# Patient Record
Sex: Female | Born: 1935 | Race: Black or African American | Hispanic: No | Smoking: Never smoker
Health system: Southern US, Community
[De-identification: ages and names within clinical notes are randomized; demographics above are authoritative.]

## PROBLEM LIST (undated history)

## (undated) DIAGNOSIS — D649 Anemia, unspecified: Secondary | ICD-10-CM

## (undated) DIAGNOSIS — J449 Chronic obstructive pulmonary disease, unspecified: Secondary | ICD-10-CM

## (undated) DIAGNOSIS — Z992 Dependence on renal dialysis: Secondary | ICD-10-CM

## (undated) DIAGNOSIS — I10 Essential (primary) hypertension: Secondary | ICD-10-CM

## (undated) DIAGNOSIS — N289 Disorder of kidney and ureter, unspecified: Secondary | ICD-10-CM

## (undated) DIAGNOSIS — H35359 Cystoid macular degeneration, unspecified eye: Secondary | ICD-10-CM

## (undated) HISTORY — DX: Chronic obstructive pulmonary disease, unspecified: J44.9

## (undated) HISTORY — PX: APPENDECTOMY: SHX54

## (undated) HISTORY — PX: CATARACT EXTRACTION: SUR2

## (undated) HISTORY — DX: Dependence on renal dialysis: Z99.2

## (undated) HISTORY — DX: Anemia, unspecified: D64.9

## (undated) HISTORY — PX: ABDOMINAL HYSTERECTOMY: SHX81

## (undated) HISTORY — PX: ARTERIOVENOUS GRAFT PLACEMENT: SUR1029

## (undated) HISTORY — PX: KYPHOSIS SURGERY: SHX114

## (undated) HISTORY — DX: Cystoid macular degeneration, unspecified eye: H35.359

---

## 1997-08-14 ENCOUNTER — Other Ambulatory Visit: Admission: RE | Admit: 1997-08-14 | Discharge: 1997-08-14 | Payer: Self-pay | Admitting: Family Medicine

## 1997-08-20 ENCOUNTER — Other Ambulatory Visit: Admission: RE | Admit: 1997-08-20 | Discharge: 1997-08-20 | Payer: Self-pay | Admitting: *Deleted

## 1999-09-23 ENCOUNTER — Encounter: Admission: RE | Admit: 1999-09-23 | Discharge: 1999-09-23 | Payer: Self-pay | Admitting: Family Medicine

## 1999-09-23 ENCOUNTER — Encounter: Payer: Self-pay | Admitting: Family Medicine

## 2000-02-03 ENCOUNTER — Encounter: Payer: Self-pay | Admitting: Family Medicine

## 2000-02-03 ENCOUNTER — Encounter: Admission: RE | Admit: 2000-02-03 | Discharge: 2000-02-03 | Payer: Self-pay | Admitting: Family Medicine

## 2000-10-13 ENCOUNTER — Encounter: Payer: Self-pay | Admitting: Family Medicine

## 2000-10-13 ENCOUNTER — Encounter: Admission: RE | Admit: 2000-10-13 | Discharge: 2000-10-13 | Payer: Self-pay | Admitting: Family Medicine

## 2002-09-27 ENCOUNTER — Ambulatory Visit (HOSPITAL_COMMUNITY): Admission: RE | Admit: 2002-09-27 | Discharge: 2002-09-27 | Payer: Self-pay | Admitting: Family Medicine

## 2002-09-27 ENCOUNTER — Encounter: Payer: Self-pay | Admitting: Family Medicine

## 2004-06-08 ENCOUNTER — Encounter: Admission: RE | Admit: 2004-06-08 | Discharge: 2004-06-08 | Payer: Self-pay | Admitting: Family Medicine

## 2004-07-16 ENCOUNTER — Ambulatory Visit (HOSPITAL_COMMUNITY): Admission: RE | Admit: 2004-07-16 | Discharge: 2004-07-16 | Payer: Self-pay | Admitting: Nephrology

## 2004-08-13 ENCOUNTER — Ambulatory Visit (HOSPITAL_COMMUNITY): Admission: RE | Admit: 2004-08-13 | Discharge: 2004-08-13 | Payer: Self-pay | Admitting: Vascular Surgery

## 2004-10-26 ENCOUNTER — Emergency Department (HOSPITAL_COMMUNITY): Admission: EM | Admit: 2004-10-26 | Discharge: 2004-10-26 | Payer: Self-pay | Admitting: Emergency Medicine

## 2004-11-17 ENCOUNTER — Ambulatory Visit (HOSPITAL_COMMUNITY): Admission: RE | Admit: 2004-11-17 | Discharge: 2004-11-17 | Payer: Self-pay | Admitting: Nephrology

## 2005-01-07 ENCOUNTER — Ambulatory Visit (HOSPITAL_COMMUNITY): Admission: RE | Admit: 2005-01-07 | Discharge: 2005-01-07 | Payer: Self-pay | Admitting: Nephrology

## 2005-01-13 ENCOUNTER — Inpatient Hospital Stay (HOSPITAL_COMMUNITY): Admission: EM | Admit: 2005-01-13 | Discharge: 2005-01-19 | Payer: Self-pay | Admitting: Emergency Medicine

## 2005-01-19 ENCOUNTER — Ambulatory Visit: Payer: Self-pay | Admitting: Cardiology

## 2005-02-23 ENCOUNTER — Encounter (HOSPITAL_COMMUNITY): Admission: RE | Admit: 2005-02-23 | Discharge: 2005-05-24 | Payer: Self-pay | Admitting: Nephrology

## 2005-04-29 ENCOUNTER — Ambulatory Visit (HOSPITAL_COMMUNITY): Admission: RE | Admit: 2005-04-29 | Discharge: 2005-04-29 | Payer: Self-pay | Admitting: Nephrology

## 2005-07-15 ENCOUNTER — Encounter: Admission: RE | Admit: 2005-07-15 | Discharge: 2005-07-15 | Payer: Self-pay | Admitting: Family Medicine

## 2006-01-16 ENCOUNTER — Emergency Department (HOSPITAL_COMMUNITY): Admission: EM | Admit: 2006-01-16 | Discharge: 2006-01-16 | Payer: Self-pay | Admitting: Emergency Medicine

## 2006-07-28 ENCOUNTER — Ambulatory Visit (HOSPITAL_COMMUNITY): Admission: RE | Admit: 2006-07-28 | Discharge: 2006-07-28 | Payer: Self-pay | Admitting: Nephrology

## 2007-08-16 ENCOUNTER — Ambulatory Visit (HOSPITAL_COMMUNITY): Admission: RE | Admit: 2007-08-16 | Discharge: 2007-08-16 | Payer: Self-pay | Admitting: Ophthalmology

## 2007-09-14 ENCOUNTER — Ambulatory Visit (HOSPITAL_COMMUNITY): Admission: RE | Admit: 2007-09-14 | Discharge: 2007-09-14 | Payer: Self-pay | Admitting: Nephrology

## 2007-10-21 ENCOUNTER — Emergency Department (HOSPITAL_COMMUNITY): Admission: EM | Admit: 2007-10-21 | Discharge: 2007-10-21 | Payer: Self-pay | Admitting: Emergency Medicine

## 2008-01-10 ENCOUNTER — Inpatient Hospital Stay (HOSPITAL_COMMUNITY): Admission: EM | Admit: 2008-01-10 | Discharge: 2008-02-05 | Payer: Self-pay | Admitting: Emergency Medicine

## 2008-01-19 ENCOUNTER — Encounter (INDEPENDENT_AMBULATORY_CARE_PROVIDER_SITE_OTHER): Payer: Self-pay | Admitting: Surgery

## 2008-03-21 ENCOUNTER — Ambulatory Visit (HOSPITAL_COMMUNITY): Admission: RE | Admit: 2008-03-21 | Discharge: 2008-03-21 | Payer: Self-pay | Admitting: Nephrology

## 2008-04-15 ENCOUNTER — Inpatient Hospital Stay (HOSPITAL_COMMUNITY): Admission: EM | Admit: 2008-04-15 | Discharge: 2008-04-17 | Payer: Self-pay | Admitting: Emergency Medicine

## 2008-04-19 ENCOUNTER — Inpatient Hospital Stay (HOSPITAL_COMMUNITY): Admission: EM | Admit: 2008-04-19 | Discharge: 2008-04-25 | Payer: Self-pay | Admitting: Emergency Medicine

## 2008-06-04 ENCOUNTER — Inpatient Hospital Stay (HOSPITAL_COMMUNITY): Admission: EM | Admit: 2008-06-04 | Discharge: 2008-06-10 | Payer: Self-pay | Admitting: Emergency Medicine

## 2008-06-05 ENCOUNTER — Encounter (INDEPENDENT_AMBULATORY_CARE_PROVIDER_SITE_OTHER): Payer: Self-pay | Admitting: Interventional Radiology

## 2008-06-06 ENCOUNTER — Encounter (INDEPENDENT_AMBULATORY_CARE_PROVIDER_SITE_OTHER): Payer: Self-pay | Admitting: Interventional Radiology

## 2008-06-18 ENCOUNTER — Encounter: Payer: Self-pay | Admitting: Interventional Radiology

## 2008-07-18 ENCOUNTER — Ambulatory Visit (HOSPITAL_COMMUNITY): Admission: RE | Admit: 2008-07-18 | Discharge: 2008-07-18 | Payer: Self-pay | Admitting: Nephrology

## 2008-08-23 ENCOUNTER — Ambulatory Visit: Payer: Self-pay | Admitting: Internal Medicine

## 2008-08-23 ENCOUNTER — Inpatient Hospital Stay (HOSPITAL_COMMUNITY): Admission: EM | Admit: 2008-08-23 | Discharge: 2008-08-29 | Payer: Self-pay | Admitting: Emergency Medicine

## 2008-08-23 ENCOUNTER — Encounter (INDEPENDENT_AMBULATORY_CARE_PROVIDER_SITE_OTHER): Payer: Self-pay | Admitting: Internal Medicine

## 2008-08-28 ENCOUNTER — Encounter (INDEPENDENT_AMBULATORY_CARE_PROVIDER_SITE_OTHER): Payer: Self-pay | Admitting: Interventional Radiology

## 2008-09-19 ENCOUNTER — Encounter: Payer: Self-pay | Admitting: Interventional Radiology

## 2008-10-01 ENCOUNTER — Ambulatory Visit (HOSPITAL_COMMUNITY): Admission: RE | Admit: 2008-10-01 | Discharge: 2008-10-01 | Payer: Self-pay | Admitting: Interventional Radiology

## 2008-10-03 ENCOUNTER — Encounter (INDEPENDENT_AMBULATORY_CARE_PROVIDER_SITE_OTHER): Payer: Self-pay | Admitting: Interventional Radiology

## 2008-10-03 ENCOUNTER — Ambulatory Visit (HOSPITAL_COMMUNITY): Admission: RE | Admit: 2008-10-03 | Discharge: 2008-10-03 | Payer: Self-pay | Admitting: Interventional Radiology

## 2008-11-13 ENCOUNTER — Ambulatory Visit (HOSPITAL_COMMUNITY): Admission: RE | Admit: 2008-11-13 | Discharge: 2008-11-13 | Payer: Self-pay | Admitting: Nephrology

## 2008-12-12 ENCOUNTER — Ambulatory Visit (HOSPITAL_COMMUNITY): Admission: RE | Admit: 2008-12-12 | Discharge: 2008-12-12 | Payer: Self-pay | Admitting: Family Medicine

## 2008-12-17 ENCOUNTER — Encounter: Payer: Self-pay | Admitting: Interventional Radiology

## 2009-02-18 ENCOUNTER — Ambulatory Visit (HOSPITAL_COMMUNITY): Admission: RE | Admit: 2009-02-18 | Discharge: 2009-02-18 | Payer: Self-pay | Admitting: Nephrology

## 2009-07-22 ENCOUNTER — Ambulatory Visit (HOSPITAL_COMMUNITY): Admission: RE | Admit: 2009-07-22 | Discharge: 2009-07-22 | Payer: Self-pay | Admitting: Nephrology

## 2009-10-21 ENCOUNTER — Encounter: Admission: RE | Admit: 2009-10-21 | Discharge: 2009-10-21 | Payer: Self-pay | Admitting: Nephrology

## 2009-11-11 ENCOUNTER — Encounter: Admission: RE | Admit: 2009-11-11 | Discharge: 2009-11-11 | Payer: Self-pay | Admitting: Family Medicine

## 2009-11-13 ENCOUNTER — Ambulatory Visit (HOSPITAL_COMMUNITY)
Admission: RE | Admit: 2009-11-13 | Discharge: 2009-11-13 | Payer: Self-pay | Source: Home / Self Care | Admitting: Nephrology

## 2009-11-21 IMAGING — NM NM GI BLOOD LOSS
2 series · 12 of 12 positions shown · non-contrast
Comparison: None

CLINICAL DATA: Active lower GI bleeding.

NUCLEAR MEDICINE GASTROINTESTINAL BLEEDING SCAN
TECHNIQUE: Sequential abdominal images were obtained following
intravenous administration of 2c-IIm labeled red blood cells.
Radiopharmaceutical:  26 mCi 2c-IIm in-vitro labeled red cells.

[Series 1: gi gi bleed · 4.70mm/px · 6 of 60 frames shown (1 of 2)]
[frame 6/60]
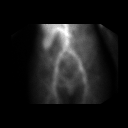
[frame 16/60]
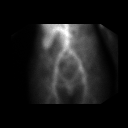
[frame 26/60]
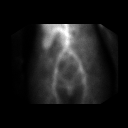
[frame 36/60]
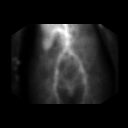
[frame 46/60]
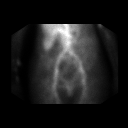
[frame 56/60]
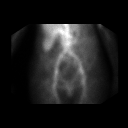

[Series 1: gi gi bleed · 4.70mm/px · 6 of 60 frames shown (2 of 2)]
[frame 6/60]
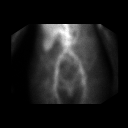
[frame 16/60]
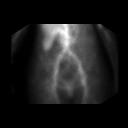
[frame 26/60]
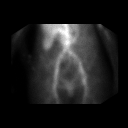
[frame 36/60]
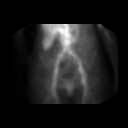
[frame 46/60]
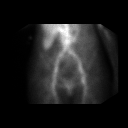
[frame 56/60]
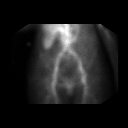

[12 of 12 positions shown; findings below may reference images not displayed]

FINDINGS: Normal radiopharmaceutical distribution of activity is
seen within the abdomen and pelvis.  No sites of active GI bleeding
are identified.  Imaging was carried [DATE] minutes.
IMPRESSION: Negative.  No evidence of active GI bleeding.

## 2009-11-27 IMAGING — CR DG CHEST 2V
1 series · 1 of 1 positions shown · non-contrast
Comparison: 08/16/2007

CLINICAL DATA: Lower GI bleed/elevated white blood cell count

CHEST - 2 VIEW

[w chest lat]
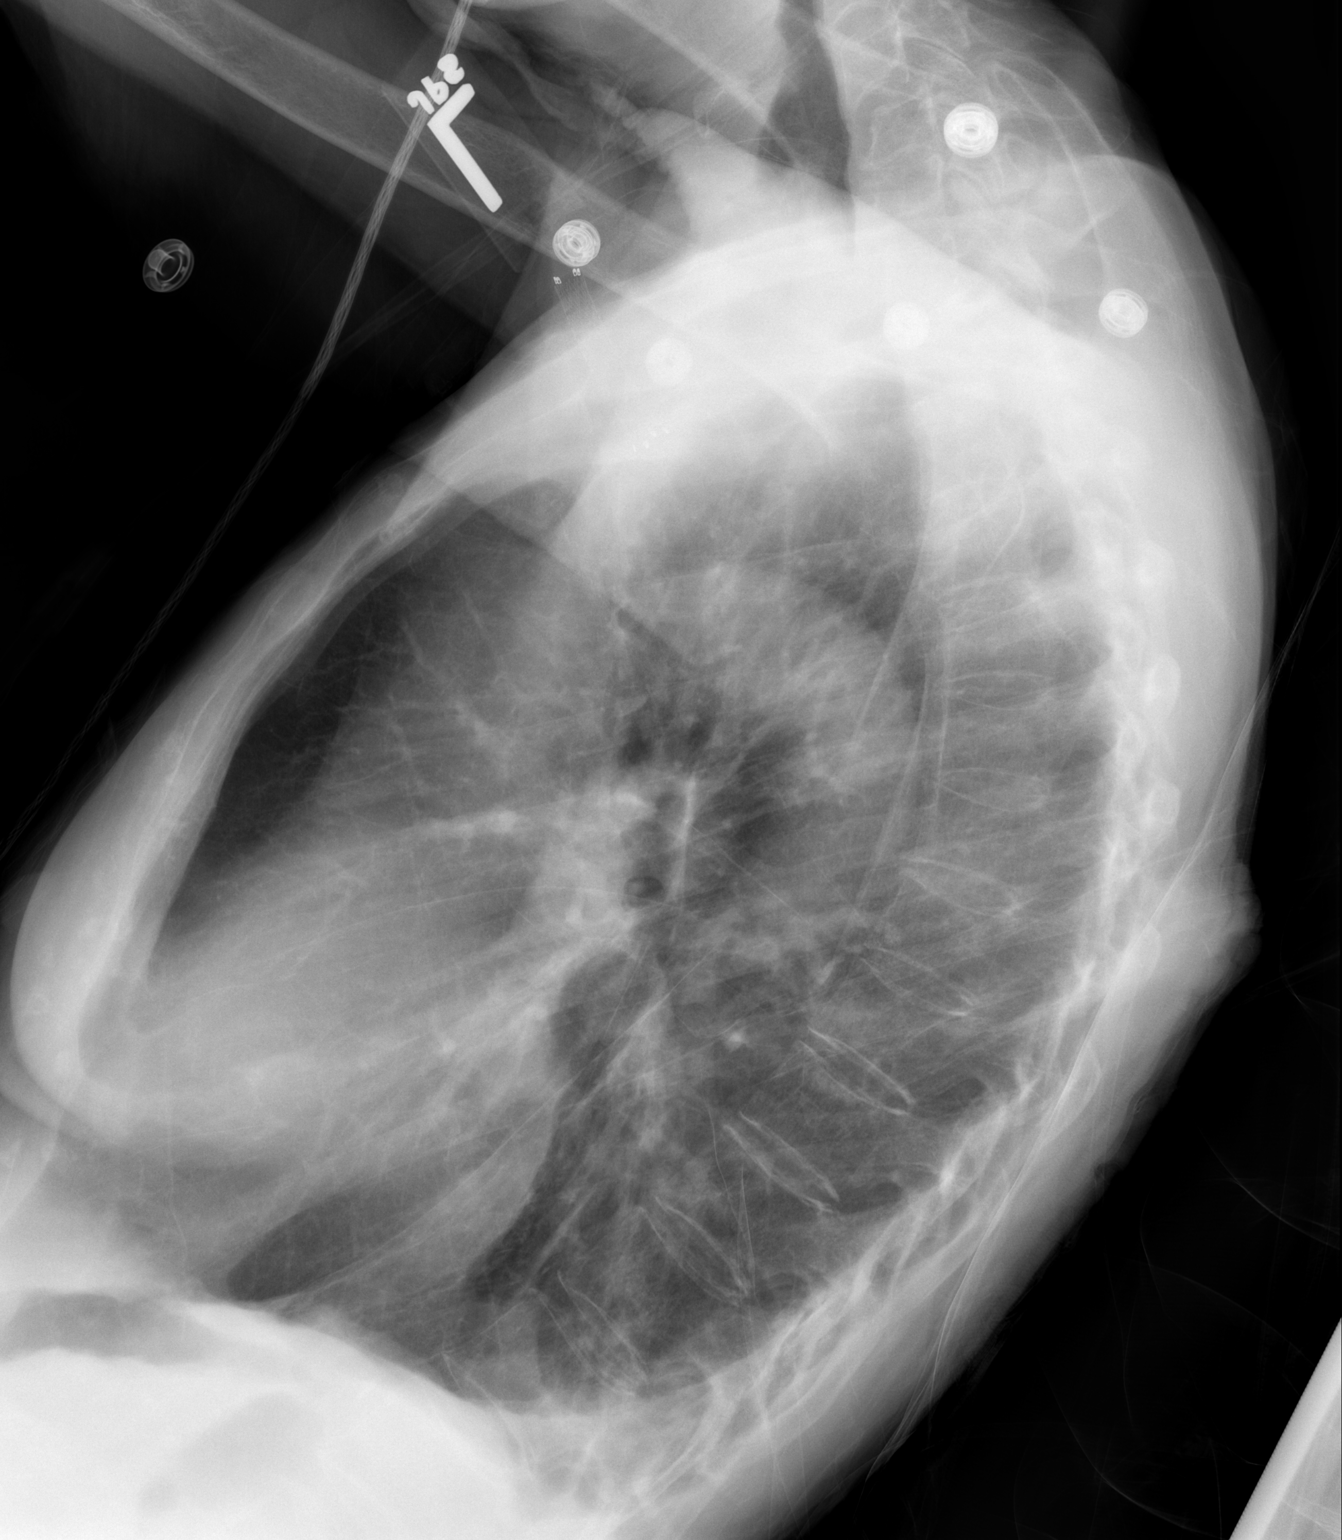

[1 of 1 positions shown; findings below may reference images not displayed]

FINDINGS: AP upright lateral views were obtained.  Heart size
within normal limits.  There is a subtle left lower lobe airspace
density seen best on the lateral view, abutting the major fissure.
There is also a small left pleural effusion.  The findings suggest
acute pneumonia.  This needs careful clinical correlation.

Stable underlying changes of COPD.
IMPRESSION: 1.  COPD.
2.  Findings suspicious for acute left lower lobe pneumonia with
small pleural effusion.

## 2010-02-17 ENCOUNTER — Ambulatory Visit (HOSPITAL_COMMUNITY)
Admission: RE | Admit: 2010-02-17 | Discharge: 2010-02-17 | Payer: Self-pay | Source: Home / Self Care | Attending: Nephrology | Admitting: Nephrology

## 2010-04-09 ENCOUNTER — Ambulatory Visit (INDEPENDENT_AMBULATORY_CARE_PROVIDER_SITE_OTHER): Payer: Medicare Other

## 2010-04-09 DIAGNOSIS — N186 End stage renal disease: Secondary | ICD-10-CM

## 2010-04-10 ENCOUNTER — Ambulatory Visit: Payer: Self-pay

## 2010-04-10 NOTE — Assessment & Plan Note (Signed)
OFFICE VISIT  [name], Shelley M DOB:  [birth year]                                       04/09/2010 [id number]#:[id number]  CHIEF COMPLAINT:  Open area scabbed-over on the arteriovenous fistula.  The patient is an 77 year old woman who had a left upper arm AV fistula placed in November 2006.  Over the last month, she has an area where they had been sticking the fistula with a scabbed area which has now gotten slightly larger.  There has been no bleeding, no purulent material.  She has had no fever or chills.  There has been no redness in the area.  She has multiple skin tears secondary to her thin skin.  She is not on any blood thinners of Plavix, aspirin, or Coumadin. Otherwise, the fistula is functioning well.  PHYSICAL EXAM:  The patient has a scab in the upper portion of her AV fistula which is intact.  There is no erythema, no drainage, and no bleeding from the area.  She has a positive bruit in the AV fistula and no signs of steal.  The area around the scab is not tense. Neurovascular exam is intact.  Vital signs:  Her heart rate is 88, her sats are 96%, and her respiratory rate is 12.  ASSESSMENT:  Open scabbed area in the upper portion of left upper arm arteriovenous fistula without drainage, erythema, or sign any signs of infection.  PLAN: 1. The plan is to not stick the fistula in this area, to stick it     below the scabbed area.  Her family will clean this area with     peroxide and apply Neosporin and a nonadhesive dressing. 2. We will see the patient back in 2 weeks.  We will advise the     grandson that if she should bleed, showed him where to occlude the     fistula, place a dressing, and bring the patient to the emergency     room.  Dialysis center was called regarding this plan so that they     would be aware of where not to stick the fistula at this time.  Della Goo, PA-C  Charles E. Fields, MD Electronically Signed  RR/MEDQ   D:  04/09/2010  T:  04/09/2010  Job:  [id number]

## 2010-04-18 ENCOUNTER — Emergency Department (HOSPITAL_COMMUNITY)
Admission: EM | Admit: 2010-04-18 | Discharge: 2010-04-18 | Disposition: A | Discharge status: Home / Self Care | Payer: Medicare Other | Attending: Emergency Medicine | Admitting: Emergency Medicine

## 2010-04-18 DIAGNOSIS — I120 Hypertensive chronic kidney disease with stage 5 chronic kidney disease or end stage renal disease: Secondary | ICD-10-CM

## 2010-04-18 DIAGNOSIS — T82898A Other specified complication of vascular prosthetic devices, implants and grafts, initial encounter: Secondary | ICD-10-CM

## 2010-04-18 DIAGNOSIS — N186 End stage renal disease: Secondary | ICD-10-CM | POA: Insufficient documentation

## 2010-04-18 DIAGNOSIS — S51809A Unspecified open wound of unspecified forearm, initial encounter: Secondary | ICD-10-CM | POA: Insufficient documentation

## 2010-04-18 DIAGNOSIS — H409 Unspecified glaucoma: Secondary | ICD-10-CM | POA: Insufficient documentation

## 2010-04-18 DIAGNOSIS — X58XXXA Exposure to other specified factors, initial encounter: Secondary | ICD-10-CM | POA: Insufficient documentation

## 2010-04-18 DIAGNOSIS — Y841 Kidney dialysis as the cause of abnormal reaction of the patient, or of later complication, without mention of misadventure at the time of the procedure: Secondary | ICD-10-CM | POA: Insufficient documentation

## 2010-04-18 DIAGNOSIS — J4489 Other specified chronic obstructive pulmonary disease: Secondary | ICD-10-CM | POA: Insufficient documentation

## 2010-04-18 DIAGNOSIS — J449 Chronic obstructive pulmonary disease, unspecified: Secondary | ICD-10-CM | POA: Insufficient documentation

## 2010-04-18 LAB — APTT: aPTT: 28 seconds (ref 24–37)

## 2010-04-18 LAB — DIFFERENTIAL
Basophils Absolute: 0 10*3/uL (ref 0.0–0.1)
Basophils Relative: 0 % (ref 0–1)
Monocytes Absolute: 1.4 10*3/uL — ABNORMAL HIGH (ref 0.1–1.0)
Neutro Abs: 10 10*3/uL — ABNORMAL HIGH (ref 1.7–7.7)
Neutrophils Relative %: 74 % (ref 43–77)

## 2010-04-18 LAB — PROTIME-INR
INR: 0.97 (ref 0.00–1.49)
Prothrombin Time: 13.1 seconds (ref 11.6–15.2)

## 2010-04-18 LAB — CBC
Hemoglobin: 9.7 g/dL — ABNORMAL LOW (ref 12.0–15.0)
MCHC: 31.8 g/dL (ref 30.0–36.0)
WBC: 13.4 10*3/uL — ABNORMAL HIGH (ref 4.0–10.5)

## 2010-04-18 LAB — SAMPLE TO BLOOD BANK

## 2010-04-20 NOTE — Consult Note (Signed)
NAMEJAKHIYA, BROWER            ACCOUNT NO.:  [id number]  MEDICAL RECORD NO.:  [id number]           PATIENT TYPE:  E  LOCATION:  [facility]                         FACILITY:  [facility]  PHYSICIAN:  Quita Skye. Hart Rochester, M.D.  DATE OF BIRTH:  05/19/1919  DATE OF CONSULTATION:  04/18/2010 DATE OF DISCHARGE:  04/18/2010                                CONSULTATION   REASON FOR CONSULTATION:  Bleeding from AV graft, left upper extremity.  HISTORY OF PRESENT ILLNESS:  This is an 77 year old female, on chronic hemodialysis, dialyzing Monday, Wednesday, and Friday at the [facility], has had 1 previous episode of bleeding from an AV graft in the left upper arm.  She was seen in our office a few weeks ago and was to have a follow-up appointment later this coming week.  She has had no further recurrent bleeding and had dialysis yesterday, but today began bleeding at home.  She was brought to the emergency department where initially her blood pressure was in the 70s, but rapidly improve with fluid infusion to systolic of 100, and heart rate of 90-100.  She was alert and oriented the entire time and was never in shot.  She has had no chills, fever, or evidence of systemic infection.  CHRONIC MEDICAL PROBLEMS: 1. COPD. 2. Anemia. 3. Diverticulosis. 4. End-stage renal disease. 5. Hypertension. 6. Glaucoma.  SOCIAL HISTORY:  Does not use alcohol or drugs and does not smoke.  FAMILY HISTORY:  Negative for coronary artery disease, diabetes, or stroke.  REVIEW OF SYSTEMS:  The patient denies any chest pain, dyspnea on exertion, chronic bronchitis, pneumonia.  Elderly lives with her family. All other systems are negative.  PHYSICAL EXAM:  VITAL SIGNS:  Blood pressure is 100/60, heart rate is 90- 100. GENERAL:  She is alert and oriented x3. HEENT:  Normal for age.  EOMs intact. CHEST:  Clear to auscultation. ABDOMEN:  Soft, nontender, with no masses. EXTREMITIES:  Upper extremity exam on  the left reveals AV graft to have a good pulse and palpable thrill.  This dressing was intact without active bleeding.  I removed the dressing and there was a linear opening over the graft midway between the antecubital area and the axilla, which was about 4 mm long.  I removed the thrombosed plug which was in this opening.  There was no evidence of infection, purulence, or gangrenous tissue, and the opening was actually quite clean with good tissue.  While compressing the graft proximally and distally, I placed four 5-0 Prolene sutures, which easily approximated the skin and control the bleeding.  We then applied a pressure dressing and observe this for an hour and half in the emergency department with no evidence of recurrent bleeding.  The patient will be discharged with a compression dressing to remain in place until dialysis on Monday February 20 and will return to our office on Tuesday February 21 for further examination.  If she should have any evidence of recurrent bleeding, she should be return to the emergency department and she will need a new left upper arm graft and a Diatek catheter placed. Family is comfortable with this  plan and the patient will be accompanied by her family members and keep a close eye on her.     Quita Skye Hart Rochester, M.D.     JDL/MEDQ  D:  04/18/2010  T:  04/19/2010  Job:  [id number]  Electronically Signed by Josephina Gip M.D. on 04/20/2010 12:26:49 PM

## 2010-04-21 ENCOUNTER — Ambulatory Visit (INDEPENDENT_AMBULATORY_CARE_PROVIDER_SITE_OTHER): Payer: Medicare Other | Admitting: Vascular Surgery

## 2010-04-21 DIAGNOSIS — N186 End stage renal disease: Secondary | ICD-10-CM

## 2010-04-22 ENCOUNTER — Inpatient Hospital Stay (HOSPITAL_COMMUNITY)
Admission: RE | Admit: 2010-04-22 | Discharge: 2010-04-30 | DRG: 264 | Disposition: A | Discharge status: Home Health | Payer: Medicare Other | Source: Ambulatory Visit | Attending: Vascular Surgery | Admitting: Vascular Surgery

## 2010-04-22 ENCOUNTER — Inpatient Hospital Stay (HOSPITAL_COMMUNITY): Payer: Medicare Other

## 2010-04-22 ENCOUNTER — Ambulatory Visit (HOSPITAL_COMMUNITY): Payer: Medicare Other

## 2010-04-22 DIAGNOSIS — N186 End stage renal disease: Secondary | ICD-10-CM

## 2010-04-22 DIAGNOSIS — T82898A Other specified complication of vascular prosthetic devices, implants and grafts, initial encounter: Secondary | ICD-10-CM

## 2010-04-22 DIAGNOSIS — Y92009 Unspecified place in unspecified non-institutional (private) residence as the place of occurrence of the external cause: Secondary | ICD-10-CM

## 2010-04-22 DIAGNOSIS — N2581 Secondary hyperparathyroidism of renal origin: Secondary | ICD-10-CM | POA: Diagnosis present

## 2010-04-22 DIAGNOSIS — Y832 Surgical operation with anastomosis, bypass or graft as the cause of abnormal reaction of the patient, or of later complication, without mention of misadventure at the time of the procedure: Secondary | ICD-10-CM | POA: Diagnosis present

## 2010-04-22 DIAGNOSIS — J4489 Other specified chronic obstructive pulmonary disease: Secondary | ICD-10-CM | POA: Diagnosis present

## 2010-04-22 DIAGNOSIS — I120 Hypertensive chronic kidney disease with stage 5 chronic kidney disease or end stage renal disease: Secondary | ICD-10-CM

## 2010-04-22 DIAGNOSIS — I959 Hypotension, unspecified: Secondary | ICD-10-CM | POA: Diagnosis not present

## 2010-04-22 DIAGNOSIS — J95811 Postprocedural pneumothorax: Secondary | ICD-10-CM | POA: Diagnosis not present

## 2010-04-22 DIAGNOSIS — Z66 Do not resuscitate: Secondary | ICD-10-CM | POA: Diagnosis not present

## 2010-04-22 DIAGNOSIS — J189 Pneumonia, unspecified organism: Secondary | ICD-10-CM | POA: Diagnosis not present

## 2010-04-22 DIAGNOSIS — J449 Chronic obstructive pulmonary disease, unspecified: Secondary | ICD-10-CM | POA: Diagnosis present

## 2010-04-22 DIAGNOSIS — Z992 Dependence on renal dialysis: Secondary | ICD-10-CM

## 2010-04-22 DIAGNOSIS — D62 Acute posthemorrhagic anemia: Secondary | ICD-10-CM | POA: Diagnosis not present

## 2010-04-22 DIAGNOSIS — Z8673 Personal history of transient ischemic attack (TIA), and cerebral infarction without residual deficits: Secondary | ICD-10-CM

## 2010-04-22 DIAGNOSIS — D631 Anemia in chronic kidney disease: Secondary | ICD-10-CM | POA: Diagnosis present

## 2010-04-22 DIAGNOSIS — E8779 Other fluid overload: Secondary | ICD-10-CM | POA: Diagnosis not present

## 2010-04-22 DIAGNOSIS — Y9210 Unspecified residential institution as the place of occurrence of the external cause: Secondary | ICD-10-CM | POA: Diagnosis not present

## 2010-04-22 DIAGNOSIS — T827XXA Infection and inflammatory reaction due to other cardiac and vascular devices, implants and grafts, initial encounter: Principal | ICD-10-CM | POA: Diagnosis present

## 2010-04-22 DIAGNOSIS — K219 Gastro-esophageal reflux disease without esophagitis: Secondary | ICD-10-CM | POA: Diagnosis present

## 2010-04-22 DIAGNOSIS — Y841 Kidney dialysis as the cause of abnormal reaction of the patient, or of later complication, without mention of misadventure at the time of the procedure: Secondary | ICD-10-CM | POA: Diagnosis not present

## 2010-04-22 LAB — CBC
HCT: 21.4 % — ABNORMAL LOW (ref 36.0–46.0)
HCT: 20.3 % — ABNORMAL LOW (ref 36.0–46.0)
Hemoglobin: 6.6 g/dL — CL (ref 12.0–15.0)
Hemoglobin: 6.6 g/dL — CL (ref 12.0–15.0)
MCH: 34.7 pg — ABNORMAL HIGH (ref 26.0–34.0)
MCHC: 30.8 g/dL (ref 30.0–36.0)
MCHC: 32.5 g/dL (ref 30.0–36.0)
MCV: 106.8 fL — ABNORMAL HIGH (ref 78.0–100.0)

## 2010-04-22 LAB — POCT I-STAT 4, (NA,K, GLUC, HGB,HCT)
HCT: 28 % — ABNORMAL LOW (ref 36.0–46.0)
Hemoglobin: 9.5 g/dL — ABNORMAL LOW (ref 12.0–15.0)

## 2010-04-22 LAB — SURGICAL PCR SCREEN: MRSA, PCR: NEGATIVE

## 2010-04-22 LAB — BASIC METABOLIC PANEL
CO2: 24 mEq/L (ref 19–32)
Calcium: 8.5 mg/dL (ref 8.4–10.5)
Glucose, Bld: 108 mg/dL — ABNORMAL HIGH (ref 70–99)
Potassium: 4.1 mEq/L (ref 3.5–5.1)
Sodium: 140 mEq/L (ref 135–145)

## 2010-04-22 NOTE — Assessment & Plan Note (Signed)
OFFICE VISIT  Shelley, DOMKE [name] DOB:  1920/01/17                                       04/21/2010 [id number]#:[id number]  This patient returns today for further followup regarding her left upper arm AV graft.  She had a bleeding episode over the weekend and the area in the left upper arm graft where the bleeding occurred was sutured with 4 Prolene sutures and controlled effectively.  Today she returns for a followup visit to inspect the wound.  On exam, the left upper arm graft is patent with an excellent pulse and palpable thrill.  There is some erythema developing around the area of the sutures.  She has a well perfused left upper extremity.  Chest is clear to auscultation.  Blood pressure of 89/53, heart rate is 97, respiratory rate 18.  I feel that we do need to replace the graft since it does appear that this could be becoming infected.  I rewrapped the arm today with compression dressing and an Ace wrap.  Discussed the situation with the patient's son.  Tomorrow she will come to [facility] for replacement of the graft, insertion of Diatek catheter.  She will be accompanied by her family tonight and if any bleeding should occur, they will bring her to the Emergency Department.    [name] Rochester, [name].D. Electronically Signed  JDL/MEDQ  D:  04/21/2010  T:  04/22/2010  Job:  [id number]

## 2010-04-23 ENCOUNTER — Ambulatory Visit: Payer: Medicare Other

## 2010-04-23 ENCOUNTER — Observation Stay (HOSPITAL_COMMUNITY): Payer: Medicare Other

## 2010-04-23 LAB — CBC
MCH: 31.3 pg (ref 26.0–34.0)
MCHC: 33.6 g/dL (ref 30.0–36.0)
MCV: 93.1 fL (ref 78.0–100.0)
Platelets: 215 10*3/uL (ref 150–400)

## 2010-04-23 LAB — CARDIAC PANEL(CRET KIN+CKTOT+MB+TROPI): CK, MB: 1.5 ng/mL (ref 0.3–4.0)

## 2010-04-23 LAB — RENAL FUNCTION PANEL
BUN: 30 mg/dL — ABNORMAL HIGH (ref 6–23)
CO2: 23 mEq/L (ref 19–32)
Calcium: 8.5 mg/dL (ref 8.4–10.5)
Creatinine, Ser: 7.44 mg/dL — ABNORMAL HIGH (ref 0.4–1.2)
GFR calc Af Amer: 6 mL/min — ABNORMAL LOW (ref >60)
GFR calc non Af Amer: 5 mL/min — ABNORMAL LOW (ref >60)

## 2010-04-24 ENCOUNTER — Observation Stay (HOSPITAL_COMMUNITY): Payer: Medicare Other

## 2010-04-24 LAB — CROSSMATCH
ABO/RH(D): O NEG
Antibody Screen: NEGATIVE
Unit division: 0
Unit division: 0

## 2010-04-24 LAB — RENAL FUNCTION PANEL
CO2: 23 mEq/L (ref 19–32)
Calcium: 8.5 mg/dL (ref 8.4–10.5)
Chloride: 103 mEq/L (ref 96–112)
GFR calc Af Amer: 7 mL/min — ABNORMAL LOW (ref >60)
GFR calc non Af Amer: 6 mL/min — ABNORMAL LOW (ref >60)
Glucose, Bld: 170 mg/dL — ABNORMAL HIGH (ref 70–99)
Potassium: 4.1 mEq/L (ref 3.5–5.1)
Sodium: 139 mEq/L (ref 135–145)

## 2010-04-24 LAB — CBC
HCT: 30.7 % — ABNORMAL LOW (ref 36.0–46.0)
Hemoglobin: 10.2 g/dL — ABNORMAL LOW (ref 12.0–15.0)
MCH: 31.2 pg (ref 26.0–34.0)
MCHC: 33.2 g/dL (ref 30.0–36.0)
MCV: 93.9 fL (ref 78.0–100.0)
Platelets: 217 K/uL (ref 150–400)
RBC: 3.27 MIL/uL — ABNORMAL LOW (ref 3.87–5.11)
RDW: 19.7 % — ABNORMAL HIGH (ref 11.5–15.5)
WBC: 13.6 K/uL — ABNORMAL HIGH (ref 4.0–10.5)

## 2010-04-25 LAB — CBC
HCT: 26.9 % — ABNORMAL LOW (ref 36.0–46.0)
Hemoglobin: 8.8 g/dL — ABNORMAL LOW (ref 12.0–15.0)
MCV: 92.8 fL (ref 78.0–100.0)
RBC: 2.9 MIL/uL — ABNORMAL LOW (ref 3.87–5.11)
WBC: 11.4 10*3/uL — ABNORMAL HIGH (ref 4.0–10.5)

## 2010-04-25 LAB — RENAL FUNCTION PANEL
BUN: 6 mg/dL (ref 6–23)
CO2: 29 mEq/L (ref 19–32)
Chloride: 100 mEq/L (ref 96–112)
Glucose, Bld: 100 mg/dL — ABNORMAL HIGH (ref 70–99)
Phosphorus: 1.9 mg/dL — ABNORMAL LOW (ref 2.3–4.6)
Potassium: 3.7 mEq/L (ref 3.5–5.1)
Sodium: 138 mEq/L (ref 135–145)

## 2010-04-25 LAB — WOUND CULTURE

## 2010-04-26 ENCOUNTER — Observation Stay (HOSPITAL_COMMUNITY): Payer: Medicare Other

## 2010-04-26 LAB — CBC
Hemoglobin: 9.3 g/dL — ABNORMAL LOW (ref 12.0–15.0)
MCH: 30.4 pg (ref 26.0–34.0)
MCV: 93.1 fL (ref 78.0–100.0)
Platelets: 225 10*3/uL (ref 150–400)
RBC: 3.06 MIL/uL — ABNORMAL LOW (ref 3.87–5.11)
WBC: 12.9 10*3/uL — ABNORMAL HIGH (ref 4.0–10.5)

## 2010-04-26 LAB — RENAL FUNCTION PANEL
Albumin: 2.8 g/dL — ABNORMAL LOW (ref 3.5–5.2)
BUN: 21 mg/dL (ref 6–23)
CO2: 26 mEq/L (ref 19–32)
Chloride: 97 mEq/L (ref 96–112)
Creatinine, Ser: 5.99 mg/dL — ABNORMAL HIGH (ref 0.4–1.2)
GFR calc non Af Amer: 7 mL/min — ABNORMAL LOW (ref >60)
Potassium: 4.1 mEq/L (ref 3.5–5.1)

## 2010-04-27 ENCOUNTER — Inpatient Hospital Stay (HOSPITAL_COMMUNITY): Payer: Medicare Other

## 2010-04-27 ENCOUNTER — Observation Stay (HOSPITAL_COMMUNITY): Payer: Medicare Other

## 2010-04-27 LAB — RENAL FUNCTION PANEL
CO2: 19 mEq/L (ref 19–32)
Chloride: 98 mEq/L (ref 96–112)
GFR calc Af Amer: 5 mL/min — ABNORMAL LOW (ref >60)
GFR calc non Af Amer: 4 mL/min — ABNORMAL LOW (ref >60)
Glucose, Bld: 207 mg/dL — ABNORMAL HIGH (ref 70–99)
Sodium: 131 mEq/L — ABNORMAL LOW (ref 135–145)

## 2010-04-27 LAB — CBC
HCT: 28.3 % — ABNORMAL LOW (ref 36.0–46.0)
Hemoglobin: 9.4 g/dL — ABNORMAL LOW (ref 12.0–15.0)
MCH: 31 pg (ref 26.0–34.0)
MCHC: 33.2 g/dL (ref 30.0–36.0)
RBC: 3.03 MIL/uL — ABNORMAL LOW (ref 3.87–5.11)

## 2010-04-27 NOTE — Op Note (Signed)
NAMEKINDELL, STRADA            ACCOUNT NO.:  [id number]  MEDICAL RECORD NO.:  [id number]           PATIENT TYPE:  I  LOCATION:  2610                         FACILITY:  [facility]  PHYSICIAN:  Quita Skye. Hart Rochester, M.D.  DATE OF BIRTH:  [birth year]  DATE OF PROCEDURE:  04/22/2010 DATE OF DISCHARGE:                              OPERATIVE REPORT   PREOPERATIVE DIAGNOSIS:  End-stage renal disease with AV Gore-Tex graft left upper arm with exposed segment with history of bleeding.  POSTOPERATIVE DIAGNOSIS:  End-stage renal disease with AV Gore-Tex graft left upper arm with exposed segment with history of bleeding.  OPERATION: 1. Insertion of a new left upper arm AV Gore-Tex graft, brachial     artery to axillary vein was 6 mm Gore-Tex. 2. Removal of exposed segment of left upper arm AV graft with primary     closure. 3. Bilateral ultrasound localization internal jugular veins. 4. Insertion Diatek catheter via right internal jugular vein (19 cm).  SURGEON:  Quita Skye. Hart Rochester, MD  FIRST ASSISTANT:  Della Goo, PA-C.  ANESTHESIA:  MAC.  PROCEDURE:  The patient was taken to the operating room, placed in supine position at which time the left upper extremity was prepped with Betadine scrub and solution draped in routine sterile manner.  After infiltration of 1% Xylocaine with epinephrine, and isolation of the exposed graft area in the mid upper arm with an adhesive drape, a short oblique incision was made near the arterial anastomosis.  Gore-Tex dissected free with 15 blade, had an excellent pulse and thrill.  Second incision was made near the axilla where the previous Gore-Tex axillary vein anastomosis had been performed.  Both of these were dissected free, new tunnel was then created peripheral to the old graft and a 6-mm Gore- Tex graft delivered through the tunnel.  The graft was occluded proximally and distally with vascular clamps and transected about 2-3 cm from the previous  arterial anastomosis.  The new graft anastomosed end- to-end with 6-0 Prolene.  The graft was then transected.  A few centimeters from the venous anastomosis, which was easily explored with a 5 dilator was widely patent.  The new graft anastomosed in end-to-end to the old graft with 6-0 Prolene.  Clamps were released, there was excellent pulse and thrill in the graft.  Adequate hemostasis achieved. Wounds were closed in layers with Vicryl in subcuticular fashion with Dermabond.  Sterile dressing applied.  Attention then turned to the area where the graft had been previously exposed, which was now nonfunctional.  After infiltration of 1% Xylocaine short longitudinal incision was made through this circular area, which was about a 1.5 cm in diameter.  The graft was very degenerative beneath this was dissected free.  The 15 blade and excised in this area and sent for culture. Adequate hemostasis was achieved.  The edges were then excised with Metzenbaum scissors.  Tissue was very poor in quality, but the attempt was made to close this using three interrupted 3-0 Vicryl sutures for the subcu and some very loosely placed 3-0 nylon sutures for the skin. Sterile dressing was then applied.  Attention turned to the upper  chest and neck, which was exposed both internal jugular veins were imaged using B-mode ultrasound, but the right noted to be bigger than the left. Both after infiltration of 1% Xylocaine prepping and draping, right internal jugular vein was entered using a supraclavicular approach after a few attempts were unsuccessful.  I did get some air upon aspiration on a few attempts in the right supraclavicular area suspicious for entering the pleural space.  The patient's O2 sats never changed and nor did her blood pressure.  After a few attempts of this, I did obtain a chest x- ray in the recovery room and there was questionable 5% apical pneumothorax.  I was then able to enter the right  internal jugular vein and a guidewire passed into the rate of right atrium under fluoroscopic guidance.  After dilating the tract appropriately a 19-cm Diatek catheter was passed through peel-away sheath positioned in the right atrium tunneled peripherally and secured with nylon suture.  Wound closed with Vicryl and subcuticular fashion.  Sterile dressing applied. The patient taken to recovery in stable condition.     Quita Skye Hart Rochester, M.D.     JDL/MEDQ  D:  04/22/2010  T:  04/23/2010  Job:  [id number]  Electronically Signed by Josephina Gip M.D. on 04/27/2010 04:15:04 PM

## 2010-04-28 ENCOUNTER — Inpatient Hospital Stay (HOSPITAL_COMMUNITY): Payer: Medicare Other

## 2010-04-28 LAB — RENAL FUNCTION PANEL
CO2: 25 mEq/L (ref 19–32)
Glucose, Bld: 71 mg/dL (ref 70–99)
Phosphorus: 1.2 mg/dL — ABNORMAL LOW (ref 2.3–4.6)
Potassium: 4.1 mEq/L (ref 3.5–5.1)
Sodium: 138 mEq/L (ref 135–145)

## 2010-04-28 LAB — CBC
Hemoglobin: 10.1 g/dL — ABNORMAL LOW (ref 12.0–15.0)
Platelets: 361 10*3/uL (ref 150–400)
RBC: 3.32 MIL/uL — ABNORMAL LOW (ref 3.87–5.11)
WBC: 18.7 10*3/uL — ABNORMAL HIGH (ref 4.0–10.5)

## 2010-04-28 LAB — GLUCOSE, CAPILLARY
Glucose-Capillary: 104 mg/dL — ABNORMAL HIGH (ref 70–99)
Glucose-Capillary: 95 mg/dL (ref 70–99)
Glucose-Capillary: 115 mg/dL — ABNORMAL HIGH (ref 70–99)
Glucose-Capillary: 172 mg/dL — ABNORMAL HIGH (ref 70–99)

## 2010-04-29 ENCOUNTER — Inpatient Hospital Stay (HOSPITAL_COMMUNITY): Payer: Medicare Other

## 2010-04-29 LAB — GLUCOSE, CAPILLARY
Glucose-Capillary: 87 mg/dL (ref 70–99)
Glucose-Capillary: 111 mg/dL — ABNORMAL HIGH (ref 70–99)

## 2010-04-29 LAB — RENAL FUNCTION PANEL
Albumin: 3 g/dL — ABNORMAL LOW (ref 3.5–5.2)
CO2: 23 mEq/L (ref 19–32)
Calcium: 9.3 mg/dL (ref 8.4–10.5)
GFR calc Af Amer: 8 mL/min — ABNORMAL LOW (ref >60)
GFR calc non Af Amer: 7 mL/min — ABNORMAL LOW (ref >60)
Phosphorus: 4.1 mg/dL (ref 2.3–4.6)
Sodium: 138 mEq/L (ref 135–145)

## 2010-04-29 LAB — CBC
Hemoglobin: 9.7 g/dL — ABNORMAL LOW (ref 12.0–15.0)
MCHC: 33.2 g/dL (ref 30.0–36.0)
Platelets: 361 10*3/uL (ref 150–400)

## 2010-04-29 LAB — IRON AND TIBC: Saturation Ratios: 37 % (ref 20–55)

## 2010-04-29 LAB — IRON: Iron: 73 ug/dL (ref 42–135)

## 2010-04-29 NOTE — Discharge Summary (Addendum)
[name], [name]            ACCOUNT NO.:  [id number]  MEDICAL RECORD NO.:  [id number]           PATIENT TYPE:  I  LOCATION:  2610                         FACILITY:  [facility]  PHYSICIAN:  Shelley Skye. Hart [name], M.D.  DATE OF BIRTH:  02-10-1920  DATE OF ADMISSION:  04/22/2010 DATE OF DISCHARGE:  04/28/2010                              DISCHARGE SUMMARY   CHIEF COMPLAINT:  Open draining wound over left upper arm AV Gore-Tex graft.  HISTORY OF PRESENT ILLNESS:  [name] is an 77 year old woman, who is on dialysis Monday, Wednesday, and Friday, who came in with a draining open wound over her left upper arm AV Gore-Tex graft.  She was brought to the operating room to have a resection of this graft and also to have a new Gore-Tex graft placed in the left upper arm around the old graft.  She was also to have an indwelling catheter placed at the same time.  The patient was to be taken to the operating room on April 22, 2010 .  PAST MEDICAL HISTORY:  Significant for hypertension, end-stage renal disease, gastroesophageal reflux, and COPD.  She also had multiple grafts for hemodialysis access.  HOSPITAL COURSE:  The patient was taken to the operating room on April 22, 2010 for insertion of a new left right upper arm AV Gore- Tex graft, brachial arteries axillary vein with 6-mm Gore-Tex.  She also had a removal of exposed segment of the left upper arm AV graft primary closure, insertion of Diatek catheter via the right internal jugular vein.  During insertion of catheter, Dr. Hart [name], noted some air in the syringe and postoperatively the patient developed a pneumothorax on the right side, the same side of the catheter.  This decreased in size over the next days.  She developed cough and fever and was noted to have some effusion and infiltrates on her chest x-ray.  She was treated with Avelox, breathing treatments, and multiple days of hemodialysis with good result.  She then was  afebrile.  Her O2 sats on room air by April 28, 2010 were 100%.  She was breathing comfortably at a normal rate.  She had been very tachypneic in the first postop days.  This improved with multiple sessions on hemodialysis and on antibiotics.  She was also given vancomycin to cover the open wound from her AV Gore-Tex graft.  She had abundant staph aureus, which was not MRSA, which is also sensitive to Avelox as well.  She will be continued on Avelox.  She will go home with her family and she will follow up in 2 weeks with Dr. Hart [name] for removal of the stitches.  She had good thrill and bruit in her AV Gore-Tex graft.  LABORATORY:  Her last H and H was 9.4 and 28.3.  Her BUN was 44 and creatinine is 9.14 and potassium was 5.0.  Vital Signs:  She was afebrile.  Vital signs were stable and she was 98- 100% on room air after hemodialysis yesterday.  FINAL DIAGNOSES: 1. Open infected wound left Gore-Tex graft, which was resected. 2. New Gore-Tex graft placed around the old graft, which is  functioning well. 3. Shortness of breath and tachypnea, something of fluid overload and     pneumonia for which she was successfully treated with good results. 4. Right pneumothorax, which resolved over several days after.  Right     IJ line was placed.  She had no need for chest tube.  DISPOSITION:  The patient was discharged to home.  She will follow up within 2 weeks with Dr. Hart [name].  DISCHARGE MEDICATIONS: 1. Tramadol 150 mg 1 to 2 tablets every 12 hours as needed for pain. 2. Vancomycin 1 gram to be given with hemodialysis. 3. Moxifloxacin 400 mg daily for 10 days. 4. Aggrenox one daily. 5. Nexium at bedtime. 6. Megestrol oral solution 40 mg b.i.d. 7. ProAir inhaler 2 puffs t.i.d. 8. Trusopt eyedrops both eyes b.i.d. 9. Xalatan eyedrops one drop to both eyes at bedtime. 10.Timolol 0.5% one drop to both eyes b.i.d. 11.Protein powder b.i.d. 12.Renal vitamins daily.     Shelley Goo, PA-C   ______________________________ [name], M.D.    RR/MEDQ  D:  04/28/2010  T:  04/28/2010  Job:  [id number]  Electronically Signed by Shelley Goo PA on 04/29/2010 02:34:46 PM Electronically Signed by Josephina Gip M.D. on 05/04/2010 02:23:02 PM

## 2010-04-30 ENCOUNTER — Inpatient Hospital Stay (HOSPITAL_COMMUNITY): Payer: Medicare Other

## 2010-04-30 LAB — GLUCOSE, CAPILLARY: Glucose-Capillary: 128 mg/dL — ABNORMAL HIGH (ref 70–99)

## 2010-04-30 LAB — CBC
MCH: 31.1 pg (ref 26.0–34.0)
MCHC: 32.6 g/dL (ref 30.0–36.0)
MCV: 95.6 fL (ref 78.0–100.0)
Platelets: 393 10*3/uL (ref 150–400)
RDW: 18.8 % — ABNORMAL HIGH (ref 11.5–15.5)

## 2010-05-07 ENCOUNTER — Ambulatory Visit (INDEPENDENT_AMBULATORY_CARE_PROVIDER_SITE_OTHER): Payer: Medicare Other

## 2010-05-07 ENCOUNTER — Other Ambulatory Visit (HOSPITAL_COMMUNITY): Payer: Medicare Other

## 2010-05-07 DIAGNOSIS — N186 End stage renal disease: Secondary | ICD-10-CM

## 2010-05-07 NOTE — Assessment & Plan Note (Signed)
OFFICE VISIT  [name], [name] DOB:  [birth year]                                       05/07/2010 ZOXWR#:[id number]  Patient is an 77 year old woman who approximately a week ago had a new left upper arm AV graft placed.  She had an area that was infected in her old AV fistula that was resected.  She had been on antibiotics.  She also had a right IJ hemodialysis catheter placed and a small pneumothorax postoperatively.  She also developed pneumonia postoperatively and was treated successfully with antibiotics.  She returns today to have her stitches removed in the left upper arm.  Her grandson states that she sometimes seems sleepy, and he has been giving her 1-2 tramadol twice a day as needed for pain.  The patient appears very comfortable in her wheelchair.  PHYSICAL EXAMINATION:  This is an elderly woman who is awake and alert, not very verbal but answering yes-and-no questions.  She is breathing comfortably.  Vital signs:  Heart rate was 85.  Her sats were 91% on room air.  Respiratory rate was 10.  Her left upper extremity wounds were healing well.  She had good sensation and motion.  She had a good thrill and bruit in the new AV graft.  Stitches were removed from the incision site.  She is receiving home health at home but can do very limited assistance secondary to her recent hospitalization.  She has become more debilitated and more difficult to maneuver at home.  ASSESSMENT/PLAN: 1. Status post excision of infected area of arteriovenous fistula,     left upper extremity, with placement of new arteriovenous Gore-Tex     graft.  All her wounds are healing well, and her sutures were     removed. 2. Debilitation secondary to prolonged hospitalization and pneumonia.     She has home-health PT, but would need a home-health aide to help     the grandson with bathing and doing other ADLs at home.  We have     asked for a home-health RN and possible  aid for this patient at     home.  She will follow up with [place] as needed.  Della Goo, PA-C  [place]. [name], M.D. Electronically Signed  RR/MEDQ  D:  05/07/2010  T:  05/07/2010  Job:  [id number]

## 2010-05-08 ENCOUNTER — Ambulatory Visit: Payer: Medicare Other

## 2010-05-12 ENCOUNTER — Ambulatory Visit (HOSPITAL_COMMUNITY)
Admission: RE | Admit: 2010-05-12 | Discharge: 2010-05-12 | Disposition: A | Discharge status: Home / Self Care | Payer: Medicare Other | Source: Ambulatory Visit | Attending: Interventional Radiology | Admitting: Interventional Radiology

## 2010-05-12 ENCOUNTER — Other Ambulatory Visit (HOSPITAL_COMMUNITY): Payer: Self-pay | Admitting: Interventional Radiology

## 2010-05-12 DIAGNOSIS — M549 Dorsalgia, unspecified: Secondary | ICD-10-CM

## 2010-05-12 DIAGNOSIS — S32009A Unspecified fracture of unspecified lumbar vertebra, initial encounter for closed fracture: Secondary | ICD-10-CM | POA: Insufficient documentation

## 2010-05-12 DIAGNOSIS — X58XXXA Exposure to other specified factors, initial encounter: Secondary | ICD-10-CM | POA: Insufficient documentation

## 2010-05-13 ENCOUNTER — Other Ambulatory Visit (HOSPITAL_COMMUNITY): Payer: Self-pay | Admitting: Interventional Radiology

## 2010-05-13 DIAGNOSIS — IMO0002 Reserved for concepts with insufficient information to code with codable children: Secondary | ICD-10-CM

## 2010-05-15 ENCOUNTER — Ambulatory Visit (HOSPITAL_COMMUNITY)
Admission: RE | Admit: 2010-05-15 | Discharge: 2010-05-15 | Disposition: A | Discharge status: Home / Self Care | Payer: Medicare Other | Source: Ambulatory Visit | Attending: Interventional Radiology | Admitting: Interventional Radiology

## 2010-05-15 DIAGNOSIS — S32009A Unspecified fracture of unspecified lumbar vertebra, initial encounter for closed fracture: Secondary | ICD-10-CM | POA: Insufficient documentation

## 2010-05-15 DIAGNOSIS — N2581 Secondary hyperparathyroidism of renal origin: Secondary | ICD-10-CM | POA: Insufficient documentation

## 2010-05-15 DIAGNOSIS — N186 End stage renal disease: Secondary | ICD-10-CM | POA: Insufficient documentation

## 2010-05-15 DIAGNOSIS — I120 Hypertensive chronic kidney disease with stage 5 chronic kidney disease or end stage renal disease: Secondary | ICD-10-CM | POA: Insufficient documentation

## 2010-05-15 DIAGNOSIS — D649 Anemia, unspecified: Secondary | ICD-10-CM | POA: Insufficient documentation

## 2010-05-15 DIAGNOSIS — J449 Chronic obstructive pulmonary disease, unspecified: Secondary | ICD-10-CM | POA: Insufficient documentation

## 2010-05-15 DIAGNOSIS — H409 Unspecified glaucoma: Secondary | ICD-10-CM | POA: Insufficient documentation

## 2010-05-15 DIAGNOSIS — J4489 Other specified chronic obstructive pulmonary disease: Secondary | ICD-10-CM | POA: Insufficient documentation

## 2010-05-15 DIAGNOSIS — Z5309 Procedure and treatment not carried out because of other contraindication: Secondary | ICD-10-CM | POA: Insufficient documentation

## 2010-05-15 DIAGNOSIS — Z8673 Personal history of transient ischemic attack (TIA), and cerebral infarction without residual deficits: Secondary | ICD-10-CM | POA: Insufficient documentation

## 2010-05-15 DIAGNOSIS — X58XXXA Exposure to other specified factors, initial encounter: Secondary | ICD-10-CM | POA: Insufficient documentation

## 2010-05-15 DIAGNOSIS — IMO0002 Reserved for concepts with insufficient information to code with codable children: Secondary | ICD-10-CM

## 2010-05-15 LAB — PROTIME-INR
INR: 1.12 (ref 0.00–1.49)
Prothrombin Time: 14.6 seconds (ref 11.6–15.2)

## 2010-05-15 LAB — CBC
MCH: 32.1 pg (ref 26.0–34.0)
MCHC: 31.4 g/dL (ref 30.0–36.0)
RDW: 20.1 % — ABNORMAL HIGH (ref 11.5–15.5)

## 2010-05-15 LAB — DIFFERENTIAL
Eosinophils Absolute: 0.3 10*3/uL (ref 0.0–0.7)
Eosinophils Relative: 2 % (ref 0–5)
Lymphocytes Relative: 7 % — ABNORMAL LOW (ref 12–46)
Lymphs Abs: 1.1 10*3/uL (ref 0.7–4.0)
Monocytes Absolute: 1.3 10*3/uL — ABNORMAL HIGH (ref 0.1–1.0)

## 2010-05-15 LAB — BASIC METABOLIC PANEL
BUN: 20 mg/dL (ref 6–23)
CO2: 30 mEq/L (ref 19–32)
Calcium: 9.5 mg/dL (ref 8.4–10.5)
GFR calc non Af Amer: 7 mL/min — ABNORMAL LOW (ref >60)
Glucose, Bld: 119 mg/dL — ABNORMAL HIGH (ref 70–99)
Potassium: 3.7 mEq/L (ref 3.5–5.1)

## 2010-05-26 ENCOUNTER — Other Ambulatory Visit: Payer: Self-pay | Admitting: Nephrology

## 2010-05-26 ENCOUNTER — Ambulatory Visit
Admission: RE | Admit: 2010-05-26 | Discharge: 2010-05-26 | Disposition: A | Discharge status: Home / Self Care | Payer: Medicare Other | Source: Ambulatory Visit | Attending: Nephrology | Admitting: Nephrology

## 2010-05-26 DIAGNOSIS — J189 Pneumonia, unspecified organism: Secondary | ICD-10-CM

## 2010-06-05 LAB — POTASSIUM: Potassium: 5 mEq/L (ref 3.5–5.1)

## 2010-06-06 LAB — BASIC METABOLIC PANEL
BUN: 17 mg/dL (ref 6–23)
CO2: 29 mEq/L (ref 19–32)
Chloride: 94 mEq/L — ABNORMAL LOW (ref 96–112)
Chloride: 96 mEq/L (ref 96–112)
Creatinine, Ser: 4.46 mg/dL — ABNORMAL HIGH (ref 0.4–1.2)
GFR calc Af Amer: 11 mL/min — ABNORMAL LOW (ref >60)
GFR calc Af Amer: 12 mL/min — ABNORMAL LOW (ref >60)
GFR calc non Af Amer: 10 mL/min — ABNORMAL LOW (ref >60)
Potassium: 5.6 mEq/L — ABNORMAL HIGH (ref 3.5–5.1)
Sodium: 137 mEq/L (ref 135–145)
Sodium: 137 mEq/L (ref 135–145)

## 2010-06-06 LAB — CBC
HCT: 38.5 % (ref 36.0–46.0)
Hemoglobin: 13 g/dL (ref 12.0–15.0)
MCV: 105 fL — ABNORMAL HIGH (ref 78.0–100.0)
Platelets: 359 10*3/uL (ref 150–400)
RBC: 3.66 MIL/uL — ABNORMAL LOW (ref 3.87–5.11)
WBC: 12.8 10*3/uL — ABNORMAL HIGH (ref 4.0–10.5)

## 2010-06-06 LAB — APTT: aPTT: 25 seconds (ref 24–37)

## 2010-06-07 LAB — GLUCOSE, CAPILLARY: Glucose-Capillary: 89 mg/dL (ref 70–99)

## 2010-06-08 LAB — CBC
HCT: 37.9 % (ref 36.0–46.0)
HCT: 47.9 % — ABNORMAL HIGH (ref 36.0–46.0)
Hemoglobin: 12.6 g/dL (ref 12.0–15.0)
Hemoglobin: 13.5 g/dL (ref 12.0–15.0)
MCHC: 33.3 g/dL (ref 30.0–36.0)
MCHC: 33.7 g/dL (ref 30.0–36.0)
MCHC: 34.1 g/dL (ref 30.0–36.0)
MCV: 100.7 fL — ABNORMAL HIGH (ref 78.0–100.0)
MCV: 101.9 fL — ABNORMAL HIGH (ref 78.0–100.0)
MCV: 102.1 fL — ABNORMAL HIGH (ref 78.0–100.0)
Platelets: 221 10*3/uL (ref 150–400)
Platelets: 257 10*3/uL (ref 150–400)
RBC: 3.71 MIL/uL — ABNORMAL LOW (ref 3.87–5.11)
RBC: 3.72 MIL/uL — ABNORMAL LOW (ref 3.87–5.11)
RBC: 3.95 MIL/uL (ref 3.87–5.11)
RBC: 4.69 MIL/uL (ref 3.87–5.11)
RDW: 18.1 % — ABNORMAL HIGH (ref 11.5–15.5)
RDW: 18.8 % — ABNORMAL HIGH (ref 11.5–15.5)
RDW: 19.1 % — ABNORMAL HIGH (ref 11.5–15.5)
WBC: 12.7 10*3/uL — ABNORMAL HIGH (ref 4.0–10.5)
WBC: 15.4 10*3/uL — ABNORMAL HIGH (ref 4.0–10.5)
WBC: 9.8 10*3/uL (ref 4.0–10.5)

## 2010-06-08 LAB — COMPREHENSIVE METABOLIC PANEL
ALT: 28 U/L (ref 0–35)
AST: 20 U/L (ref 0–37)
CO2: 23 mEq/L (ref 19–32)
Calcium: 10.7 mg/dL — ABNORMAL HIGH (ref 8.4–10.5)
Calcium: 9.6 mg/dL (ref 8.4–10.5)
Creatinine, Ser: 5.4 mg/dL — ABNORMAL HIGH (ref 0.4–1.2)
Creatinine, Ser: 7.96 mg/dL — ABNORMAL HIGH (ref 0.4–1.2)
GFR calc Af Amer: 6 mL/min — ABNORMAL LOW (ref >60)
GFR calc Af Amer: 9 mL/min — ABNORMAL LOW (ref >60)
GFR calc non Af Amer: 5 mL/min — ABNORMAL LOW (ref >60)
Glucose, Bld: 123 mg/dL — ABNORMAL HIGH (ref 70–99)
Potassium: 4 mEq/L (ref 3.5–5.1)
Sodium: 131 mEq/L — ABNORMAL LOW (ref 135–145)
Total Protein: 6.7 g/dL (ref 6.0–8.3)
Total Protein: 8.6 g/dL — ABNORMAL HIGH (ref 6.0–8.3)

## 2010-06-08 LAB — CULTURE, BLOOD (ROUTINE X 2): Culture: NO GROWTH

## 2010-06-08 LAB — CARDIAC PANEL(CRET KIN+CKTOT+MB+TROPI)
CK, MB: 2.6 ng/mL (ref 0.3–4.0)
Relative Index: INVALID (ref 0.0–2.5)
Relative Index: INVALID (ref 0.0–2.5)
Total CK: 22 U/L (ref 7–177)
Total CK: 23 U/L (ref 7–177)

## 2010-06-08 LAB — DIFFERENTIAL
Eosinophils Absolute: 0.2 10*3/uL (ref 0.0–0.7)
Lymphocytes Relative: 11 % — ABNORMAL LOW (ref 12–46)
Lymphs Abs: 1.7 10*3/uL (ref 0.7–4.0)
Monocytes Relative: 6 % (ref 3–12)
Neutro Abs: 12.6 10*3/uL — ABNORMAL HIGH (ref 1.7–7.7)
Neutrophils Relative %: 82 % — ABNORMAL HIGH (ref 43–77)

## 2010-06-08 LAB — RENAL FUNCTION PANEL
Albumin: 3.7 g/dL (ref 3.5–5.2)
Chloride: 98 mEq/L (ref 96–112)
GFR calc Af Amer: 8 mL/min — ABNORMAL LOW (ref >60)
GFR calc non Af Amer: 7 mL/min — ABNORMAL LOW (ref >60)
Phosphorus: 3.5 mg/dL (ref 2.3–4.6)
Potassium: 5 mEq/L (ref 3.5–5.1)

## 2010-06-08 LAB — GLUCOSE, CAPILLARY: Glucose-Capillary: 105 mg/dL — ABNORMAL HIGH (ref 70–99)

## 2010-06-08 LAB — POCT CARDIAC MARKERS
CKMB, poc: 1.6 ng/mL (ref 1.0–8.0)
Myoglobin, poc: 311 ng/mL (ref 12–200)
Troponin i, poc: <0.05 ng/mL (ref 0.00–0.09)

## 2010-06-08 LAB — URINALYSIS, MICROSCOPIC ONLY
Glucose, UA: NEGATIVE mg/dL
Protein, ur: >300 mg/dL — AB
pH: 6.5 (ref 5.0–8.0)

## 2010-06-08 LAB — BASIC METABOLIC PANEL
CO2: 25 mEq/L (ref 19–32)
Calcium: 9.7 mg/dL (ref 8.4–10.5)
Chloride: 97 mEq/L (ref 96–112)
GFR calc Af Amer: 6 mL/min — ABNORMAL LOW (ref >60)
Glucose, Bld: 104 mg/dL — ABNORMAL HIGH (ref 70–99)
Potassium: 4.7 mEq/L (ref 3.5–5.1)
Sodium: 132 mEq/L — ABNORMAL LOW (ref 135–145)

## 2010-06-08 LAB — PROTIME-INR: INR: 1 (ref 0.00–1.49)

## 2010-06-08 LAB — PHOSPHORUS: Phosphorus: 2.7 mg/dL (ref 2.3–4.6)

## 2010-06-08 LAB — CORTISOL: Cortisol, Plasma: 2.4 ug/dL

## 2010-06-10 ENCOUNTER — Other Ambulatory Visit (HOSPITAL_COMMUNITY): Payer: Self-pay | Admitting: Interventional Radiology

## 2010-06-10 DIAGNOSIS — IMO0002 Reserved for concepts with insufficient information to code with codable children: Secondary | ICD-10-CM

## 2010-06-10 LAB — DIFFERENTIAL
Basophils Absolute: 0 10*3/uL (ref 0.0–0.1)
Basophils Relative: 0 % (ref 0–1)
Eosinophils Absolute: 0.3 10*3/uL (ref 0.0–0.7)
Lymphocytes Relative: 16 % (ref 12–46)
Monocytes Relative: 6 % (ref 3–12)
Neutrophils Relative %: 75 % (ref 43–77)

## 2010-06-10 LAB — CBC
HCT: 34.2 % — ABNORMAL LOW (ref 36.0–46.0)
HCT: 38.1 % (ref 36.0–46.0)
Hemoglobin: 11.9 g/dL — ABNORMAL LOW (ref 12.0–15.0)
Hemoglobin: 12.9 g/dL (ref 12.0–15.0)
MCHC: 33.4 g/dL (ref 30.0–36.0)
MCHC: 34.1 g/dL (ref 30.0–36.0)
MCV: 96.3 fL (ref 78.0–100.0)
MCV: 96.5 fL (ref 78.0–100.0)
MCV: 96.6 fL (ref 78.0–100.0)
MCV: 97.3 fL (ref 78.0–100.0)
Platelets: 280 10*3/uL (ref 150–400)
Platelets: 290 10*3/uL (ref 150–400)
Platelets: 324 10*3/uL (ref 150–400)
Platelets: 327 10*3/uL (ref 150–400)
RBC: 3.63 MIL/uL — ABNORMAL LOW (ref 3.87–5.11)
RDW: 21.3 % — ABNORMAL HIGH (ref 11.5–15.5)
RDW: 23.3 % — ABNORMAL HIGH (ref 11.5–15.5)
RDW: 23.4 % — ABNORMAL HIGH (ref 11.5–15.5)
WBC: 10.7 10*3/uL — ABNORMAL HIGH (ref 4.0–10.5)
WBC: 7.8 10*3/uL (ref 4.0–10.5)

## 2010-06-10 LAB — BASIC METABOLIC PANEL
Chloride: 97 mEq/L (ref 96–112)
Creatinine, Ser: 4.16 mg/dL — ABNORMAL HIGH (ref 0.4–1.2)
GFR calc Af Amer: 12 mL/min — ABNORMAL LOW (ref >60)
Sodium: 131 mEq/L — ABNORMAL LOW (ref 135–145)

## 2010-06-10 LAB — URINE MICROSCOPIC-ADD ON

## 2010-06-10 LAB — COMPREHENSIVE METABOLIC PANEL
ALT: 12 U/L (ref 0–35)
Albumin: 3 g/dL — ABNORMAL LOW (ref 3.5–5.2)
Albumin: 3.3 g/dL — ABNORMAL LOW (ref 3.5–5.2)
Alkaline Phosphatase: 49 U/L (ref 39–117)
Alkaline Phosphatase: 51 U/L (ref 39–117)
BUN: 34 mg/dL — ABNORMAL HIGH (ref 6–23)
Creatinine, Ser: 8.38 mg/dL — ABNORMAL HIGH (ref 0.4–1.2)
Glucose, Bld: 90 mg/dL (ref 70–99)
Potassium: 4.3 mEq/L (ref 3.5–5.1)
Potassium: 4.4 mEq/L (ref 3.5–5.1)
Sodium: 139 mEq/L (ref 135–145)
Total Bilirubin: 0.7 mg/dL (ref 0.3–1.2)
Total Protein: 6.3 g/dL (ref 6.0–8.3)
Total Protein: 6.9 g/dL (ref 6.0–8.3)

## 2010-06-10 LAB — URINALYSIS, ROUTINE W REFLEX MICROSCOPIC
Protein, ur: 100 mg/dL — AB
Specific Gravity, Urine: 1.019 (ref 1.005–1.030)
Urobilinogen, UA: 0.2 mg/dL (ref 0.0–1.0)

## 2010-06-10 LAB — RENAL FUNCTION PANEL
Albumin: 2.8 g/dL — ABNORMAL LOW (ref 3.5–5.2)
CO2: 19 mEq/L (ref 19–32)
Calcium: 9.2 mg/dL (ref 8.4–10.5)
Calcium: 9.7 mg/dL (ref 8.4–10.5)
Chloride: 106 mEq/L (ref 96–112)
Creatinine, Ser: 6.23 mg/dL — ABNORMAL HIGH (ref 0.4–1.2)
GFR calc Af Amer: 5 mL/min — ABNORMAL LOW (ref >60)
GFR calc Af Amer: 8 mL/min — ABNORMAL LOW (ref >60)
GFR calc non Af Amer: 4 mL/min — ABNORMAL LOW (ref >60)
GFR calc non Af Amer: 6 mL/min — ABNORMAL LOW (ref >60)
Potassium: 5.7 mEq/L — ABNORMAL HIGH (ref 3.5–5.1)
Sodium: 139 mEq/L (ref 135–145)

## 2010-06-10 LAB — PROTIME-INR
INR: 1 (ref 0.00–1.49)
Prothrombin Time: 13.6 seconds (ref 11.6–15.2)
Prothrombin Time: 13.9 seconds (ref 11.6–15.2)

## 2010-06-10 LAB — BODY FLUID CULTURE

## 2010-06-16 ENCOUNTER — Other Ambulatory Visit (HOSPITAL_COMMUNITY): Payer: Self-pay | Admitting: Interventional Radiology

## 2010-06-16 ENCOUNTER — Ambulatory Visit (HOSPITAL_COMMUNITY)
Admission: RE | Admit: 2010-06-16 | Discharge: 2010-06-16 | Disposition: A | Discharge status: Home / Self Care | Payer: Medicare Other | Source: Ambulatory Visit | Attending: Interventional Radiology | Admitting: Interventional Radiology

## 2010-06-16 DIAGNOSIS — N2581 Secondary hyperparathyroidism of renal origin: Secondary | ICD-10-CM | POA: Insufficient documentation

## 2010-06-16 DIAGNOSIS — D649 Anemia, unspecified: Secondary | ICD-10-CM | POA: Insufficient documentation

## 2010-06-16 DIAGNOSIS — IMO0002 Reserved for concepts with insufficient information to code with codable children: Secondary | ICD-10-CM

## 2010-06-16 DIAGNOSIS — M810 Age-related osteoporosis without current pathological fracture: Secondary | ICD-10-CM | POA: Insufficient documentation

## 2010-06-16 DIAGNOSIS — J449 Chronic obstructive pulmonary disease, unspecified: Secondary | ICD-10-CM | POA: Insufficient documentation

## 2010-06-16 DIAGNOSIS — G459 Transient cerebral ischemic attack, unspecified: Secondary | ICD-10-CM | POA: Insufficient documentation

## 2010-06-16 DIAGNOSIS — J4489 Other specified chronic obstructive pulmonary disease: Secondary | ICD-10-CM | POA: Insufficient documentation

## 2010-06-16 DIAGNOSIS — H409 Unspecified glaucoma: Secondary | ICD-10-CM | POA: Insufficient documentation

## 2010-06-16 DIAGNOSIS — K219 Gastro-esophageal reflux disease without esophagitis: Secondary | ICD-10-CM | POA: Insufficient documentation

## 2010-06-16 DIAGNOSIS — N186 End stage renal disease: Secondary | ICD-10-CM | POA: Insufficient documentation

## 2010-06-16 DIAGNOSIS — M8448XA Pathological fracture, other site, initial encounter for fracture: Secondary | ICD-10-CM | POA: Insufficient documentation

## 2010-06-16 DIAGNOSIS — I120 Hypertensive chronic kidney disease with stage 5 chronic kidney disease or end stage renal disease: Secondary | ICD-10-CM | POA: Insufficient documentation

## 2010-06-16 DIAGNOSIS — Z01812 Encounter for preprocedural laboratory examination: Secondary | ICD-10-CM | POA: Insufficient documentation

## 2010-06-16 LAB — CBC
HCT: 46.3 % — ABNORMAL HIGH (ref 36.0–46.0)
HCT: 43.7 % (ref 36.0–46.0)
HCT: 31.3 % — ABNORMAL LOW (ref 36.0–46.0)
HCT: 32.6 % — ABNORMAL LOW (ref 36.0–46.0)
HCT: 42.6 % (ref 36.0–46.0)
Hemoglobin: 10.7 g/dL — ABNORMAL LOW (ref 12.0–15.0)
Hemoglobin: 11.7 g/dL — ABNORMAL LOW (ref 12.0–15.0)
Hemoglobin: 14.4 g/dL (ref 12.0–15.0)
Hemoglobin: 14.5 g/dL (ref 12.0–15.0)
Hemoglobin: 14.9 g/dL (ref 12.0–15.0)
MCHC: 31.8 g/dL (ref 30.0–36.0)
MCHC: 33.4 g/dL (ref 30.0–36.0)
MCHC: 33.4 g/dL (ref 30.0–36.0)
MCHC: 34.1 g/dL (ref 30.0–36.0)
MCHC: 34.1 g/dL (ref 30.0–36.0)
MCHC: 34.2 g/dL (ref 30.0–36.0)
MCHC: 34.3 g/dL (ref 30.0–36.0)
MCV: 86.7 fL (ref 78.0–100.0)
MCV: 86.3 fL (ref 78.0–100.0)
MCV: 87.3 fL (ref 78.0–100.0)
MCV: 88 fL (ref 78.0–100.0)
MCV: 88.1 fL (ref 78.0–100.0)
Platelets: 256 10*3/uL (ref 150–400)
Platelets: 205 10*3/uL (ref 150–400)
Platelets: 246 10*3/uL (ref 150–400)
Platelets: 250 10*3/uL (ref 150–400)
Platelets: 268 10*3/uL (ref 150–400)
Platelets: 416 10*3/uL — ABNORMAL HIGH (ref 150–400)
RBC: 3.85 MIL/uL — ABNORMAL LOW (ref 3.87–5.11)
RDW: 18.4 % — ABNORMAL HIGH (ref 11.5–15.5)
RDW: 16.7 % — ABNORMAL HIGH (ref 11.5–15.5)
RDW: 16.9 % — ABNORMAL HIGH (ref 11.5–15.5)
RDW: 16.9 % — ABNORMAL HIGH (ref 11.5–15.5)
RDW: 17.1 % — ABNORMAL HIGH (ref 11.5–15.5)
RDW: 17.1 % — ABNORMAL HIGH (ref 11.5–15.5)
RDW: 17.2 % — ABNORMAL HIGH (ref 11.5–15.5)
RDW: 17.6 % — ABNORMAL HIGH (ref 11.5–15.5)
WBC: 26 10*3/uL — ABNORMAL HIGH (ref 4.0–10.5)
WBC: 17.3 10*3/uL — ABNORMAL HIGH (ref 4.0–10.5)
WBC: 18.7 10*3/uL — ABNORMAL HIGH (ref 4.0–10.5)

## 2010-06-16 LAB — URINE CULTURE
Colony Count: NO GROWTH
Colony Count: NO GROWTH
Culture: NO GROWTH
Culture: NO GROWTH

## 2010-06-16 LAB — URINALYSIS, ROUTINE W REFLEX MICROSCOPIC
Glucose, UA: NEGATIVE mg/dL
Ketones, ur: NEGATIVE mg/dL
Ketones, ur: 15 mg/dL — AB
Nitrite: NEGATIVE
Protein, ur: 100 mg/dL — AB
Urobilinogen, UA: 0.2 mg/dL (ref 0.0–1.0)
Urobilinogen, UA: 0.2 mg/dL (ref 0.0–1.0)

## 2010-06-16 LAB — DIFFERENTIAL
Basophils Absolute: 0 10*3/uL (ref 0.0–0.1)
Basophils Absolute: 0 10*3/uL (ref 0.0–0.1)
Basophils Absolute: 0 10*3/uL (ref 0.0–0.1)
Basophils Relative: 0 % (ref 0–1)
Basophils Relative: 0 % (ref 0–1)
Eosinophils Absolute: 0 10*3/uL (ref 0.0–0.7)
Lymphocytes Relative: 5 % — ABNORMAL LOW (ref 12–46)
Monocytes Absolute: 0.8 10*3/uL (ref 0.1–1.0)
Monocytes Absolute: 1.2 10*3/uL — ABNORMAL HIGH (ref 0.1–1.0)
Monocytes Relative: 6 % (ref 3–12)
Neutro Abs: 14 10*3/uL — ABNORMAL HIGH (ref 1.7–7.7)
Neutro Abs: 16.1 10*3/uL — ABNORMAL HIGH (ref 1.7–7.7)
Neutrophils Relative %: 92 % — ABNORMAL HIGH (ref 43–77)
Neutrophils Relative %: 86 % — ABNORMAL HIGH (ref 43–77)
Neutrophils Relative %: 86 % — ABNORMAL HIGH (ref 43–77)

## 2010-06-16 LAB — CLOSTRIDIUM DIFFICILE EIA
C difficile Toxins A+B, EIA: NEGATIVE
C difficile Toxins A+B, EIA: NEGATIVE
C difficile Toxins A+B, EIA: NEGATIVE

## 2010-06-16 LAB — LIPID PANEL
LDL Cholesterol: 58 mg/dL (ref 0–99)
Triglycerides: 120 mg/dL (ref <150)

## 2010-06-16 LAB — CULTURE, BLOOD (ROUTINE X 2)
Culture: NO GROWTH
Culture: NO GROWTH
Culture: NO GROWTH

## 2010-06-16 LAB — COMPREHENSIVE METABOLIC PANEL
ALT: 12 U/L (ref 0–35)
AST: 37 U/L (ref 0–37)
AST: 17 U/L (ref 0–37)
Albumin: 2.4 g/dL — ABNORMAL LOW (ref 3.5–5.2)
Alkaline Phosphatase: 50 U/L (ref 39–117)
Alkaline Phosphatase: 52 U/L (ref 39–117)
BUN: 60 mg/dL — ABNORMAL HIGH (ref 6–23)
BUN: 68 mg/dL — ABNORMAL HIGH (ref 6–23)
CO2: 18 mEq/L — ABNORMAL LOW (ref 19–32)
Calcium: 10.2 mg/dL (ref 8.4–10.5)
Calcium: 8.5 mg/dL (ref 8.4–10.5)
Chloride: 92 mEq/L — ABNORMAL LOW (ref 96–112)
Creatinine, Ser: 8.63 mg/dL — ABNORMAL HIGH (ref 0.4–1.2)
Creatinine, Ser: 3.54 mg/dL — ABNORMAL HIGH (ref 0.4–1.2)
Creatinine, Ser: 7.81 mg/dL — ABNORMAL HIGH (ref 0.4–1.2)
Creatinine, Ser: 8.16 mg/dL — ABNORMAL HIGH (ref 0.4–1.2)
GFR calc Af Amer: 5 mL/min — ABNORMAL LOW (ref >60)
GFR calc Af Amer: 15 mL/min — ABNORMAL LOW (ref >60)
GFR calc non Af Amer: 4 mL/min — ABNORMAL LOW (ref >60)
GFR calc non Af Amer: 5 mL/min — ABNORMAL LOW (ref >60)
Glucose, Bld: 135 mg/dL — ABNORMAL HIGH (ref 70–99)
Glucose, Bld: 115 mg/dL — ABNORMAL HIGH (ref 70–99)
Glucose, Bld: 128 mg/dL — ABNORMAL HIGH (ref 70–99)
Potassium: 4.8 mEq/L (ref 3.5–5.1)
Potassium: 5 mEq/L (ref 3.5–5.1)
Sodium: 138 mEq/L (ref 135–145)
Total Bilirubin: 0.7 mg/dL (ref 0.3–1.2)
Total Bilirubin: 1.2 mg/dL (ref 0.3–1.2)
Total Protein: 5.7 g/dL — ABNORMAL LOW (ref 6.0–8.3)
Total Protein: 7.5 g/dL (ref 6.0–8.3)

## 2010-06-16 LAB — RENAL FUNCTION PANEL
Albumin: 2.3 g/dL — ABNORMAL LOW (ref 3.5–5.2)
Albumin: 2.3 g/dL — ABNORMAL LOW (ref 3.5–5.2)
BUN: 20 mg/dL (ref 6–23)
BUN: 36 mg/dL — ABNORMAL HIGH (ref 6–23)
BUN: 52 mg/dL — ABNORMAL HIGH (ref 6–23)
CO2: 27 mEq/L (ref 19–32)
CO2: 29 mEq/L (ref 19–32)
Calcium: 10.1 mg/dL (ref 8.4–10.5)
Calcium: 8.8 mg/dL (ref 8.4–10.5)
Calcium: 8.9 mg/dL (ref 8.4–10.5)
Calcium: 9.2 mg/dL (ref 8.4–10.5)
Chloride: 101 mEq/L (ref 96–112)
Chloride: 94 mEq/L — ABNORMAL LOW (ref 96–112)
Creatinine, Ser: 3.98 mg/dL — ABNORMAL HIGH (ref 0.4–1.2)
Creatinine, Ser: 6.78 mg/dL — ABNORMAL HIGH (ref 0.4–1.2)
Creatinine, Ser: 7.22 mg/dL — ABNORMAL HIGH (ref 0.4–1.2)
Creatinine, Ser: 7.91 mg/dL — ABNORMAL HIGH (ref 0.4–1.2)
GFR calc Af Amer: 6 mL/min — ABNORMAL LOW (ref >60)
GFR calc Af Amer: 7 mL/min — ABNORMAL LOW (ref >60)
GFR calc non Af Amer: 5 mL/min — ABNORMAL LOW (ref >60)
GFR calc non Af Amer: 6 mL/min — ABNORMAL LOW (ref >60)
Glucose, Bld: 103 mg/dL — ABNORMAL HIGH (ref 70–99)
Glucose, Bld: 106 mg/dL — ABNORMAL HIGH (ref 70–99)
Glucose, Bld: 112 mg/dL — ABNORMAL HIGH (ref 70–99)
Glucose, Bld: 99 mg/dL (ref 70–99)
Phosphorus: 1.8 mg/dL — ABNORMAL LOW (ref 2.3–4.6)
Phosphorus: 2.1 mg/dL — ABNORMAL LOW (ref 2.3–4.6)
Phosphorus: 2.6 mg/dL (ref 2.3–4.6)
Phosphorus: 5.6 mg/dL — ABNORMAL HIGH (ref 2.3–4.6)
Potassium: 4.9 mEq/L (ref 3.5–5.1)
Sodium: 131 mEq/L — ABNORMAL LOW (ref 135–145)
Sodium: 135 mEq/L (ref 135–145)
Sodium: 137 mEq/L (ref 135–145)

## 2010-06-16 LAB — BASIC METABOLIC PANEL
BUN: 11 mg/dL (ref 6–23)
CO2: 22 mEq/L (ref 19–32)
CO2: 28 mEq/L (ref 19–32)
Calcium: 9.6 mg/dL (ref 8.4–10.5)
Chloride: 94 mEq/L — ABNORMAL LOW (ref 96–112)
Creatinine, Ser: 6 mg/dL — ABNORMAL HIGH (ref 0.4–1.2)
GFR calc non Af Amer: 11 mL/min — ABNORMAL LOW (ref >60)
Glucose, Bld: 110 mg/dL — ABNORMAL HIGH (ref 70–99)
Glucose, Bld: 82 mg/dL (ref 70–99)
Potassium: 4.1 mEq/L (ref 3.5–5.1)
Sodium: 131 mEq/L — ABNORMAL LOW (ref 135–145)

## 2010-06-16 LAB — POCT I-STAT, CHEM 8
Calcium, Ion: 0.93 mmol/L — ABNORMAL LOW (ref 1.12–1.32)
Chloride: 97 mEq/L (ref 96–112)
Creatinine, Ser: 9.6 mg/dL — ABNORMAL HIGH (ref 0.4–1.2)
Glucose, Bld: 92 mg/dL (ref 70–99)
HCT: 51 % — ABNORMAL HIGH (ref 36.0–46.0)
HCT: 47 % — ABNORMAL HIGH (ref 36.0–46.0)
Hemoglobin: 17.3 g/dL — ABNORMAL HIGH (ref 12.0–15.0)
Hemoglobin: 16 g/dL — ABNORMAL HIGH (ref 12.0–15.0)
Potassium: 7.8 mEq/L (ref 3.5–5.1)
Sodium: 132 mEq/L — ABNORMAL LOW (ref 135–145)
TCO2: 18 mmol/L (ref 0–100)

## 2010-06-16 LAB — PROTIME-INR
INR: 0.98 (ref 0.00–1.49)
Prothrombin Time: 12.7 seconds (ref 11.6–15.2)

## 2010-06-16 LAB — DRUGS OF ABUSE SCREEN W/O ALC, ROUTINE URINE
Benzodiazepines.: NEGATIVE
Cocaine Metabolites: NEGATIVE
Marijuana Metabolite: NEGATIVE
Opiate Screen, Urine: NEGATIVE
Propoxyphene: NEGATIVE

## 2010-06-16 LAB — URINE MICROSCOPIC-ADD ON

## 2010-06-16 LAB — HOMOCYSTEINE: Homocysteine: 27.2 umol/L — ABNORMAL HIGH (ref 4.0–15.4)

## 2010-06-16 LAB — CARDIAC PANEL(CRET KIN+CKTOT+MB+TROPI)
CK, MB: 2.5 ng/mL (ref 0.3–4.0)
Relative Index: INVALID (ref 0.0–2.5)
Total CK: 28 U/L (ref 7–177)
Troponin I: 0.04 ng/mL (ref 0.00–0.06)
Troponin I: 0.05 ng/mL (ref 0.00–0.06)
Troponin I: 0.06 ng/mL (ref 0.00–0.06)

## 2010-06-16 LAB — CK TOTAL AND CKMB (NOT AT ARMC)
CK, MB: 0.8 ng/mL (ref 0.3–4.0)
CK, MB: 1.6 ng/mL (ref 0.3–4.0)
Total CK: 16 U/L (ref 7–177)

## 2010-06-16 LAB — POCT CARDIAC MARKERS
CKMB, poc: <1 ng/mL — ABNORMAL LOW (ref 1.0–8.0)
Myoglobin, poc: 280 ng/mL (ref 12–200)
Myoglobin, poc: 261 ng/mL (ref 12–200)

## 2010-06-16 LAB — TSH: TSH: 3.35 u[IU]/mL (ref 0.350–4.500)

## 2010-06-16 LAB — BRAIN NATRIURETIC PEPTIDE: Pro B Natriuretic peptide (BNP): 180 pg/mL — ABNORMAL HIGH (ref 0.0–100.0)

## 2010-06-16 LAB — HEMOGLOBIN A1C: Mean Plasma Glucose: 131 mg/dL

## 2010-06-16 LAB — TROPONIN I: Troponin I: 0.03 ng/mL (ref 0.00–0.06)

## 2010-06-16 MED ORDER — IOHEXOL 300 MG/ML  SOLN
50.0000 mL | Freq: Once | INTRAMUSCULAR | Status: AC | PRN
  Administered 2010-06-16: 5 mL

## 2010-06-18 ENCOUNTER — Ambulatory Visit (HOSPITAL_COMMUNITY): Payer: Medicare Other | Attending: Surgery

## 2010-06-18 DIAGNOSIS — I120 Hypertensive chronic kidney disease with stage 5 chronic kidney disease or end stage renal disease: Secondary | ICD-10-CM

## 2010-06-18 DIAGNOSIS — Z452 Encounter for adjustment and management of vascular access device: Secondary | ICD-10-CM

## 2010-06-18 DIAGNOSIS — N186 End stage renal disease: Secondary | ICD-10-CM

## 2010-06-23 ENCOUNTER — Other Ambulatory Visit (HOSPITAL_COMMUNITY): Payer: Self-pay | Admitting: Interventional Radiology

## 2010-06-23 DIAGNOSIS — IMO0002 Reserved for concepts with insufficient information to code with codable children: Secondary | ICD-10-CM

## 2010-06-30 ENCOUNTER — Emergency Department (HOSPITAL_COMMUNITY): Payer: Medicare Other

## 2010-06-30 ENCOUNTER — Ambulatory Visit (HOSPITAL_COMMUNITY)
Admission: RE | Admit: 2010-06-30 | Discharge: 2010-06-30 | Disposition: A | Discharge status: Home / Self Care | Payer: Medicare Other | Source: Ambulatory Visit | Attending: Interventional Radiology | Admitting: Interventional Radiology

## 2010-06-30 ENCOUNTER — Encounter (HOSPITAL_COMMUNITY): Payer: Self-pay | Admitting: Radiology

## 2010-06-30 ENCOUNTER — Emergency Department (HOSPITAL_COMMUNITY)
Admission: EM | Admit: 2010-06-30 | Discharge: 2010-06-30 | Disposition: A | Discharge status: Home / Self Care | Payer: Medicare Other | Attending: Emergency Medicine | Admitting: Emergency Medicine

## 2010-06-30 DIAGNOSIS — Z992 Dependence on renal dialysis: Secondary | ICD-10-CM | POA: Insufficient documentation

## 2010-06-30 DIAGNOSIS — J449 Chronic obstructive pulmonary disease, unspecified: Secondary | ICD-10-CM | POA: Insufficient documentation

## 2010-06-30 DIAGNOSIS — R5381 Other malaise: Secondary | ICD-10-CM | POA: Insufficient documentation

## 2010-06-30 DIAGNOSIS — I120 Hypertensive chronic kidney disease with stage 5 chronic kidney disease or end stage renal disease: Secondary | ICD-10-CM | POA: Insufficient documentation

## 2010-06-30 DIAGNOSIS — N186 End stage renal disease: Secondary | ICD-10-CM | POA: Insufficient documentation

## 2010-06-30 DIAGNOSIS — H409 Unspecified glaucoma: Secondary | ICD-10-CM | POA: Insufficient documentation

## 2010-06-30 DIAGNOSIS — R5383 Other fatigue: Secondary | ICD-10-CM | POA: Insufficient documentation

## 2010-06-30 DIAGNOSIS — G9389 Other specified disorders of brain: Secondary | ICD-10-CM | POA: Insufficient documentation

## 2010-06-30 DIAGNOSIS — Z79899 Other long term (current) drug therapy: Secondary | ICD-10-CM | POA: Insufficient documentation

## 2010-06-30 DIAGNOSIS — I959 Hypotension, unspecified: Secondary | ICD-10-CM | POA: Insufficient documentation

## 2010-06-30 DIAGNOSIS — IMO0002 Reserved for concepts with insufficient information to code with codable children: Secondary | ICD-10-CM

## 2010-06-30 DIAGNOSIS — J4489 Other specified chronic obstructive pulmonary disease: Secondary | ICD-10-CM | POA: Insufficient documentation

## 2010-06-30 DIAGNOSIS — R61 Generalized hyperhidrosis: Secondary | ICD-10-CM | POA: Insufficient documentation

## 2010-06-30 DIAGNOSIS — R0682 Tachypnea, not elsewhere classified: Secondary | ICD-10-CM | POA: Insufficient documentation

## 2010-06-30 HISTORY — DX: Disorder of kidney and ureter, unspecified: N28.9

## 2010-06-30 HISTORY — DX: Essential (primary) hypertension: I10

## 2010-06-30 LAB — CBC
MCH: 30.2 pg (ref 26.0–34.0)
MCHC: 31.9 g/dL (ref 30.0–36.0)
Platelets: 327 10*3/uL (ref 150–400)
RBC: 4.07 MIL/uL (ref 3.87–5.11)

## 2010-06-30 LAB — COMPREHENSIVE METABOLIC PANEL
AST: 40 U/L — ABNORMAL HIGH (ref 0–37)
Albumin: 2.9 g/dL — ABNORMAL LOW (ref 3.5–5.2)
Calcium: 9.3 mg/dL (ref 8.4–10.5)
Creatinine, Ser: 4.22 mg/dL — ABNORMAL HIGH (ref 0.4–1.2)
GFR calc Af Amer: 12 mL/min — ABNORMAL LOW (ref >60)
Sodium: 137 mEq/L (ref 135–145)
Total Protein: 6.6 g/dL (ref 6.0–8.3)

## 2010-06-30 LAB — DIFFERENTIAL
Basophils Absolute: 0 10*3/uL (ref 0.0–0.1)
Basophils Relative: 0 % (ref 0–1)
Eosinophils Absolute: 0.1 10*3/uL (ref 0.0–0.7)
Monocytes Absolute: 0.9 10*3/uL (ref 0.1–1.0)
Monocytes Relative: 6 % (ref 3–12)
Neutrophils Relative %: 82 % — ABNORMAL HIGH (ref 43–77)

## 2010-07-02 ENCOUNTER — Other Ambulatory Visit (HOSPITAL_COMMUNITY): Payer: Self-pay | Admitting: Interventional Radiology

## 2010-07-02 DIAGNOSIS — M549 Dorsalgia, unspecified: Secondary | ICD-10-CM

## 2010-07-07 ENCOUNTER — Ambulatory Visit (HOSPITAL_COMMUNITY)
Admission: RE | Admit: 2010-07-07 | Discharge: 2010-07-07 | Disposition: A | Discharge status: Home / Self Care | Payer: Medicare Other | Source: Ambulatory Visit | Attending: Interventional Radiology | Admitting: Interventional Radiology

## 2010-07-07 ENCOUNTER — Other Ambulatory Visit (HOSPITAL_COMMUNITY): Payer: Self-pay | Admitting: Interventional Radiology

## 2010-07-07 DIAGNOSIS — Q613 Polycystic kidney, unspecified: Secondary | ICD-10-CM | POA: Insufficient documentation

## 2010-07-07 DIAGNOSIS — M549 Dorsalgia, unspecified: Secondary | ICD-10-CM

## 2010-07-07 DIAGNOSIS — R609 Edema, unspecified: Secondary | ICD-10-CM | POA: Insufficient documentation

## 2010-07-08 ENCOUNTER — Other Ambulatory Visit (HOSPITAL_COMMUNITY): Payer: Self-pay | Admitting: Interventional Radiology

## 2010-07-08 DIAGNOSIS — IMO0002 Reserved for concepts with insufficient information to code with codable children: Secondary | ICD-10-CM

## 2010-07-14 ENCOUNTER — Other Ambulatory Visit: Payer: Self-pay | Admitting: Interventional Radiology

## 2010-07-14 ENCOUNTER — Ambulatory Visit (HOSPITAL_COMMUNITY): Payer: Medicare Other

## 2010-07-14 ENCOUNTER — Ambulatory Visit (HOSPITAL_COMMUNITY)
Admission: RE | Admit: 2010-07-14 | Discharge: 2010-07-14 | Disposition: A | Discharge status: Home / Self Care | Payer: Medicare Other | Source: Ambulatory Visit | Attending: Interventional Radiology | Admitting: Interventional Radiology

## 2010-07-14 DIAGNOSIS — N2581 Secondary hyperparathyroidism of renal origin: Secondary | ICD-10-CM | POA: Insufficient documentation

## 2010-07-14 DIAGNOSIS — Z8673 Personal history of transient ischemic attack (TIA), and cerebral infarction without residual deficits: Secondary | ICD-10-CM | POA: Insufficient documentation

## 2010-07-14 DIAGNOSIS — M8448XA Pathological fracture, other site, initial encounter for fracture: Secondary | ICD-10-CM | POA: Insufficient documentation

## 2010-07-14 DIAGNOSIS — I120 Hypertensive chronic kidney disease with stage 5 chronic kidney disease or end stage renal disease: Secondary | ICD-10-CM | POA: Insufficient documentation

## 2010-07-14 DIAGNOSIS — D649 Anemia, unspecified: Secondary | ICD-10-CM | POA: Insufficient documentation

## 2010-07-14 DIAGNOSIS — J449 Chronic obstructive pulmonary disease, unspecified: Secondary | ICD-10-CM | POA: Insufficient documentation

## 2010-07-14 DIAGNOSIS — H409 Unspecified glaucoma: Secondary | ICD-10-CM | POA: Insufficient documentation

## 2010-07-14 DIAGNOSIS — IMO0002 Reserved for concepts with insufficient information to code with codable children: Secondary | ICD-10-CM

## 2010-07-14 DIAGNOSIS — N186 End stage renal disease: Secondary | ICD-10-CM | POA: Insufficient documentation

## 2010-07-14 DIAGNOSIS — Z79899 Other long term (current) drug therapy: Secondary | ICD-10-CM | POA: Insufficient documentation

## 2010-07-14 DIAGNOSIS — D72829 Elevated white blood cell count, unspecified: Secondary | ICD-10-CM | POA: Insufficient documentation

## 2010-07-14 DIAGNOSIS — J4489 Other specified chronic obstructive pulmonary disease: Secondary | ICD-10-CM | POA: Insufficient documentation

## 2010-07-14 DIAGNOSIS — M810 Age-related osteoporosis without current pathological fracture: Secondary | ICD-10-CM | POA: Insufficient documentation

## 2010-07-14 DIAGNOSIS — K219 Gastro-esophageal reflux disease without esophagitis: Secondary | ICD-10-CM | POA: Insufficient documentation

## 2010-07-14 DIAGNOSIS — Z01812 Encounter for preprocedural laboratory examination: Secondary | ICD-10-CM | POA: Insufficient documentation

## 2010-07-14 LAB — POCT I-STAT, CHEM 8
Creatinine, Ser: 3.7 mg/dL — ABNORMAL HIGH (ref 0.4–1.2)
Hemoglobin: 13.6 g/dL (ref 12.0–15.0)
Sodium: 136 mEq/L (ref 135–145)
TCO2: 32 mmol/L (ref 0–100)

## 2010-07-14 LAB — CBC
MCH: 29.7 pg (ref 26.0–34.0)
Platelets: 343 10*3/uL (ref 150–400)
RBC: 4.11 MIL/uL (ref 3.87–5.11)
WBC: 12.3 10*3/uL — ABNORMAL HIGH (ref 4.0–10.5)

## 2010-07-14 LAB — APTT: aPTT: 30 seconds (ref 24–37)

## 2010-07-14 LAB — PROTIME-INR: Prothrombin Time: 13 seconds (ref 11.6–15.2)

## 2010-07-14 NOTE — Discharge Summary (Signed)
NAMEKALANDRA, MASTERS            ACCOUNT NO.:  [id number]   MEDICAL RECORD NO.:  [id number]          PATIENT TYPE:  INP   LOCATION:  6739                         FACILITY:  [facility]   PHYSICIAN:  Ruthy Dick, MD    DATE OF BIRTH:  12/24/1919   DATE OF ADMISSION:  08/22/2008  DATE OF DISCHARGE:  08/29/2008                               DISCHARGE SUMMARY   ADDENDUM   Please refer to discharge summary by Dr. Darden Palmer on August 27, 2008, for full details of the patient's hospital course, final discharge  diagnoses, discharge instructions, and discharge medications up to August 27, 2008.  In the interim, the patient was not discharged because she  continued to complain of back pain.  Because of this, Interventional  Radiology consult was sought to evaluate the new compression fracture at  L1 level.  At the evaluation by Interventional Radiology, the  kyphoplasty was done yesterday and this seems to be helping at this  point.  Today, I saw the patient, and the patient has told me that her  pain seems to have almost resolved and she is eager to go home today.  She has no other complaints whatsoever today, no chest pain, no  shortness of breath, no abdominal pain, no nausea, no vomiting, no  diarrhea, no constipation, and back pain has improved significantly.  I  have spoken to the patient's daughter, Kirt Boys over the phone, and she is  also satisfied and is coming over to take the patient home.  As already  noted in Dr. Samara Deist discharge summary, the patient is going to need  home health with physical therapy.  Instruction by the Interventional  Radiology has been written in the instruction paper.  Specifically, the  patient is to follow with Dr. Julieanne Cotton on September 10, 2008, at 2  p.m. in the Radiology Department at [facility].  The patient is to call  [phone], this has been written in the instruction paper.  Further  instruction is the patient to ambulate with a walker and  assistance as  tolerated, but avoid bending, stooping, twisting, and lifting.  For the  rest of the followup plan, please see Dr. Samara Deist discharge summary.   PHYSICAL EXAMINATION:  CHEST:  Clear to auscultation bilaterally.  ABDOMEN:  Soft and nontender.  EXTREMITIES:  No clubbing, no cyanosis, and no edema.  CARDIOVASCULAR:  Some first and second heart sounds only.  CENTRAL NERVOUS SYSTEM:  Nonfocal.      Ruthy Dick, MD  Electronically Signed     GU/MEDQ  D:  08/29/2008  T:  08/30/2008  Job:  [id number]   cc:   Sanjeev K. Corliss Skains, M.D.  James L. Deterding, M.D.  Broadus John T. Pamalee Leyden, MD

## 2010-07-14 NOTE — H&P (Signed)
[name], [name]            ACCOUNT NO.:  [id number]   MEDICAL RECORD NO.:  [id number]          PATIENT TYPE:  INP   LOCATION:  6737                         FACILITY:  [facility]   PHYSICIAN:  Vania Rea, M.D. DATE OF BIRTH:  Apr 23, 1919   DATE OF ADMISSION:  06/03/2008  DATE OF DISCHARGE:                              HISTORY & PHYSICAL   HISTORY OF PRESENT ILLNESS:  [name] is a 77 year old female with  past medical history of COPD, hypertension, anemia of chronic disease,  end-stage renal disease, and also history of GI bleed secondary to  diverticulosis status post colectomy and colostomy in November 2009, who  came to the emergency department complaining of lower back pain  aggravated by movement, sitting up, rolling over or any other minimal  movement.  The patient reports that the pain started about 3 days prior  to admission and has continuously worsened throughout the weekend.  Today the patient was even unable to receive her hemodialysis secondary  to pain.  The patient denies chest pain, abdominal pain, nausea,  vomiting, fever, chills or any other discomfort.   REVIEW OF SYSTEMS:  A 12-point review of system exam has been negative  except for her back pain and other findings as described in the history  of present illness.   PAST MEDICAL HISTORY:  1. COPD.  2. Hypertension.  3. Anemia of chronic disease.  4. End-stage renal disease on hemodialysis Monday, Wednesday and      Friday.  5. Glaucoma.  6. History of GI bleed status post colectomy and colostomy secondary      to diverticulosis.  7. Gastroesophageal reflux disease.  8. Hyperparathyroidism.  9. Hyperphosphatemia.  10.History of transient ischemic attack with failure of aspirin.   ALLERGIES:  None.   SOCIAL HISTORY:  She is a nondrinker, nonsmoker.  No drug abuse.   MEDICATIONS:  1. Questran 4 grams p.o. b.i.d.  2. Nephro-Vite 1 tablet p.o. daily.  3. Megace 800 mg daily.  4. Protonix 40  mg daily.  5. PhosLo 660 mg by mouth t.i.d.  6. Hectorol 4 mcg during hemodialysis Monday, Wednesday, Friday.  7. Aggrenox 1 tablet by mouth twice a day.  8. Aranesp 100 mcg on Monday during hemodialysis.  9. Trusopt eye drops 1 drop in each eye.  10.Timolol eye drops b.i.d.  11.Xalatan 0.005% in her left eye at bedtime.   FAMILY HISTORY:  Noncontributory at her age.   PHYSICAL EXAMINATION:  VITAL SIGNS:  Blood pressure 161/73, heart rate  85, respiratory rate 20, oxygen saturation 100% on room air.  GENERAL:  The patient was lying in bed in no acute distress if she  actually stays still but complaining of excruciating pain of her lower  back with minimal movement.  LUNGS:  Clear to auscultation bilaterally.  No crackles or rhonchi.  HEART: Regular rate and rhythm without murmur, gallops or rubs.  ABDOMEN: Colostomy bag in place.  Normal appearance.  No erythema around  the bag. Positive bowel sounds.  No abdominal tenderness appreciated.  EXTREMITIES:  No edema, cyanosis or clubbing.  Positive leg raising at  45-50 degrees,  especially on the right side.  Pain localized on lower  back with this maneuver.  NEUROLOGIC EXAM:  The patient was alert, awake and oriented x3 with  cranial nerves II-XII intact.  Muscle strength 4-5 bilaterally without  any focal deficit and normal finger-to-nose.   IMAGING STUDIES:  The patient had a CT of abdomen and pelvis which  impression is demonstrating multiple bilateral renal cysts and also a  large cystic lesion in the left pelvis which is new since study in  November 2009.  The patient had a chest x-ray that was stable for  changes of COPD with no acute abnormality.  The patient also had  ultrasound of her pelvis and also transvaginal ultrasound showing no  visualized uterus or ovaries and also confirming the 7.5 x 6.2 x 3.6 cm  left pelvic fluid collection, adjacent to the surgical clips.   LABORATORY DATA:  Admission white blood cells 11.1,  hemoglobin 12.9,  platelet 327.  Sodium 138, potassium 4.4, chloride 105, bicarb 24, BUN  34, creatinine 8.38, blood sugar 90, calcium 9.6, albumin 3.3.  The  patient had a urinalysis was small leukocytes, large amount of protein,  rare bacteria and 3 to 6 white blood cells.   ASSESSMENT AND PLAN:  The patient had been admitted secondary to:  1. Low back pain.  With a history of osteopenia and old lumbar      compression fracture, most likely is that she is experiencing pain      coming from her old fracture versus a new fracture.  We are going      to check an MRI without contrast and in the morning we are going to      with the spine surgeons for an opinion.  In the meanwhile, we are      going to provide pain medication in order to control her pain.  2. End-stage renal disease.  We are going to call the renal service      and we are going to continue with hemodialysis.  The patient is due      for hemodialysis since she was unable to receive it yesterday,      secondary to her pain.  Will continue Nephro-Vite daily.  3. Hyperphosphatemia.  We are going to check a phosphorus level.  We      are going to continue Hectorol and Phos-Lo,  The renal service to      adjust doses as needed.  4. Anemia of chronic disease.  Hemoglobin at this point 12.9.  The      patient looks dry on exam and with this hemoglobin level secondary      to dehydration.  We are going to monitor her hemoglobin and patient      will continue receiving Aranesp during hemodialysis at discretion      of renal service.  5. Glaucoma.  We are going to continue home prescription of Timolol,      Trusopt and Xalatan drops.  6. Hypertension which is well controlled just with hemodialysis.  The      patient is due for hemodialysis today and is also in pain.  For      those reasons, we are not going to start any treatment for her      elevated blood pressure at this point.  We are going to continue to      just monitor her  and depending on further readings, we will start  appropriate treatment.  7. Low appetite.  We are going to continue Megace.  8. Gastroesophageal reflux disease.  We are going to continue using      Protonix 40 mg by mouth daily.  9. History of transient ischemic attack with failure to aspirin.  The      patient is going to continue using Aggrenox twice a day.  10.Prophylaxis.  She will get Lovenox and also Protonix.      Rosanna Randy, MD  Electronically Signed      Vania Rea, M.D.  Electronically Signed    CEM/MEDQ  D:  06/04/2008  T:  06/04/2008  Job:  [id number]

## 2010-07-14 NOTE — Op Note (Signed)
[name], [name]            ACCOUNT NO.:  [id number]   MEDICAL RECORD NO.:  [id number]          PATIENT TYPE:  INP   LOCATION:  6531                         FACILITY:  [facility]   PHYSICIAN:  John C. Madilyn Fireman, M.D.    DATE OF BIRTH:  [birth year]   DATE OF PROCEDURE:  01/12/2008  DATE OF DISCHARGE:                               OPERATIVE REPORT   INDICATION FOR PROCEDURE:  Lower GI bleeding, requiring 6 units packed  red blood cells.  The patient has never had colonoscopy.  There was  never had any previous colonoscopy or GI bleeding episodes.   PROCEDURE:  The patient was placed in the left lateral decubitus  position and placed on the pulse monitor with continuous low-flow oxygen  delivered by nasal cannula.  She was sedated with 50 mcg of IV fentanyl  and 3 mg of IV Versed.  The Olympus video colonoscope was inserted into  the rectum and advanced to the cecum, confirmed by transillumination of  McBurney's point and visualization of the ileocecal valve and  appendiceal orifice.  The prep was fairly good.  Within the cecum and  ascending colon, there were seen 2 or 3 small diverticuli with no stigma  of hemorrhage and there was no bloody fluid proximal to the hepatic  flexure.  There were several polyps measuring from 2-12 mm in the cecum  and ascending colon.  Now, due to the setting of acute recent bleeding,  I did not biopsy or cauterize them.  Somewhere in the transverse colon  toward the descending colon, there began to be more numerous diverticuli  around the splenic flexure.  There were some bloody fluid that persisted  all the way down to the rectum with some small areas of clots.  There  was vigorous lavage.  I did not see any active bleeding.  I did not see  any particular diverticulum with any acute stigma of hemorrhage.  The  diverticuli were most numerous in the sigmoid colon.  No other lesions  were seen in the descending colon, sigmoid colon, and rectum.  There  were  some old clots in the rectum.  I spent a fair amount of time  lavaging, this seemed to be old and not associated with any active  bleeding.  The scope was then withdrawn and the patient returned to the  recovery room in stable condition.  She tolerated the procedure well and  there were no immediate complications.   IMPRESSION:  1. Extensive diverticulosis, greater on the left than right with      bloody fluid in the left colon.  2. Multiple colon polyps, not addressed on this procedure.  Suspect      her bleeding was from diverticulosis probably from the left colon.   PLAN:  Monitor stools and hemoglobin, and if bleeding recurs, we would  probably again attempt localization with pooled RBC scan followed by  attempted therapeutic arteriography.           ______________________________  Everardo All. Madilyn Fireman, M.D.     JCH/MEDQ  D:  01/12/2008  T:  01/12/2008  Job:  [id number]

## 2010-07-14 NOTE — H&P (Signed)
NAMESEFERINA, BROKAW            ACCOUNT NO.:  [id number]   MEDICAL RECORD NO.:  [id number]          PATIENT TYPE:  INP   LOCATION:  6740                         FACILITY:  [facility]   PHYSICIAN:  Alvin C. Lowell Guitar, M.D.  DATE OF BIRTH:  05/05/1919   DATE OF ADMISSION:  04/14/2008  DATE OF DISCHARGE:                              HISTORY & PHYSICAL   Kemler is an 77 year old female who is currently a nursing home  resident at [facility] receiving rehab.  She presents with  acute onset of malaise and weakness, and reports feverish sensation this  morning associated with sweat.  She was brought to the emergency room  where she was found to have a potassium of 7.8 mEq/L, a white blood  count of 26,000, and 3-6 white blood cells per high-power field on  urinalysis.  She denies any dysuria.  In the emergency room, she was  treated for elevated potassium with sodium bicarbonate, Kayexalate,  albuterol, and intravenous calcium.  Electrocardiogram did not show any  flattening of her QRS complex, but T waves were peak.  She is admitted  for emergency dialysis and treatment of her infectious source.   PAST HISTORY:  Subtotal colectomy for diverticular bleed with ileostomy,  cholecystectomy, history of ventral hernia repair, history of central  retinal vein occlusion, glaucoma, history of TIA.   MEDICATIONS PRIOR TO ADMISSION:  1. Questran 4 g b.i.d.  2. Nephro-Vite 1 tablet daily.  3. ProMod Protein.  4. Megace oral 400 mg daily.  5. PhosLo.  6. Betimol 0.05% b.i.d. both eyes.  7. Trusopt 2% one drop both eyes b.i.d.  8. Xalatan 0.005 both eyes nightly 1 drop.  9. Zyrtec 10 mg daily.  10.Aggrenox 1 tablet daily.  11.Nexium 40 mg daily.   SOCIAL HISTORY:  She currently resides in the nursing home.  No alcohol  or cigarette consumption.   PHYSICAL EXAMINATION:  VITAL SIGNS:  Blood pressure 172/102, heart rate  113, temperature is 97 degrees.  GENERAL:  An ill-appearing  African American female.  HEENT:  Atraumatic and normocephalic.  LUNGS:  Clear.  HEART:  Regular rhythm.  ABDOMEN:  Soft.  No bladder tenderness.  Ileostomy, right mid abdomen  present.  EXTREMITIES:  Carotid graft, left upper extremity.  No edema in lower  extremities.  Occasional tremor.  NEUROLOGICAL:  The patient is oriented in all spheres.   LABORATORY DATA:  Potassium 7.8, bicarb 18.  White blood count 26,000,  hemoglobin 15.6.  Chest x-ray, COPD.  Urinalysis,  specific gravity  1.018, pH 7 with 3-6 white blood cells, 0-3 red blood cells, nitrites  negative.   ASSESSMENT:  1. Hyperkalemia.  2. __________ left upper extremity arteriovenous graft.  3. Leukocytosis, rule out sepsis.   PLAN:  Emergent dialysis and continue antibiotic therapy.           ______________________________  Mindi Slicker. Lowell Guitar, M.D.     ACP/MEDQ  D:  04/15/2008  T:  04/15/2008  Job:  [id number]

## 2010-07-14 NOTE — Discharge Summary (Signed)
[name], [name]            ACCOUNT NO.:  [id number]   MEDICAL RECORD NO.:  [id number]          PATIENT TYPE:  INP   LOCATION:  6737                         FACILITY:  [facility]   PHYSICIAN:  Lonia Blood, M.D.DATE OF BIRTH:  02-12-1920   DATE OF ADMISSION:  06/03/2008  DATE OF DISCHARGE:  06/10/2008                               DISCHARGE SUMMARY   PRIMARY CARE PHYSICIAN:  [employer].   DISCHARGE DIAGNOSES:  1. T11-T12 compression fractures - Status post kyphoplasty.  2. Pelvic cystic fluid collection.      a.     7.5 x 4.5 x 6.3 cm via CT scan.      b.     Status post directed needle aspiration - Pathology results       benign.      c.     Followup imaging recommended in 3 to 6 months.  3. End-stage renal disease, on Monday, Wednesday, Friday hemodialysis      schedule.  4. Chronic obstructive pulmonary disease - Well compensated - Not      requiring medical therapy.  5. Hypertension.  6. Anemia of chronic disease.  7. Glaucoma.  8. History of severe gastrointestinal bleed status post colectomy and      colostomy secondary to diverticulosis.  9. Gastroesophageal reflux disease.  10.Hyperphosphatemia.  11.History of transient ischemic attack resulting in Aggrenox therapy.   DISCHARGE MEDICATIONS:  1. Questran 4 g p.o. b.i.d.  2. Nephro-Vite one p.o. daily.  3. Bromide protein powder added to each meal.  4. Megace 800 mg daily.  5. Betimol 0.5% ophthalmic solution 1 drop in each eye b.i.d.  6. Trusopt 2% ophthalmic solution 1 drop in each eye b.i.d.  7. Xalatan 0.005% solution 1 drop in each eye q.h.s.  8. Protonix 40 mg daily.  9. Imodium p.r.n. severe diarrhea.  10.PhosLo 667 mg t.i.d. with meals.  11.Aranesp 25 mcg every Tuesday at dialysis.  12.Aggrenox 1 capsule b.i.d.   FOLLOWUP:  1. Patient is instructed to call Dr. Corliss Skains with [facility] at [phone] and arrange for      reevaluation/post kyphoplasty  evaluation in 2 weeks.  2. Patient is advised to follow up with her primary care physician      within the next 7 to 10 days for routine medical reevaluation.  It      should be assured that the patient is ambulating well about the      house and tolerating her current medication regimen.  3. Patient is to continue her current Monday, Wednesday, Friday      hemodialysis schedule under the auspices of [employer].   CONSULTATIONS:  1. Interventional radiology.  2. [employer].   PROCEDURES:  1. T11 and T12 vertebroplasty, June 06, 2008.  2. CT scan-directed abscess drainage, June 05, 2008 - Thick, tenacious      liquid collected - Pathology benign.  3. MRI of the lumbosacral spine, June 04, 2008 - Acute/subacute      superior end plate fractures of T11 and  T12.  4. CT scan of the abdomen and pelvis, June 03, 2008 - Large cystic      lesion in the left pelvis, new since study of November 2009.      Measurement is 7.5 x 5.4 x 6.3 cm.   HOSPITAL COURSE:  [name] is a very pleasant 77 year old  female with a complex medical history as detailed above.  She presented  to the hospital on June 03, 2008, complaining of severe low back pain.  The patient's pain started approximately 3 days prior to her admission.  She not able to recall any specific trauma or other injuries.  Patient  was admitted to acute units because of the intractable nature of her  pain.  CT scan of the abdomen and pelvis was carried out for full  evaluation.  This in fact revealed an incidental finding of a quite  large left pelvic fluid collection/cystic lesion.  This was further  evaluated with a transvaginal ultrasound.  This confirmed the presence  of this lesion and suggested it was within a good location for  transcutaneous biopsy.  Interventional radiology was consulted and this  was carried out on June 05, 2008.  Ultimately, the cells that were able  to be  accomplished during the biopsy were benign.  Patient was informed  of this finding and it is recommended that the lesion simply be  reevaluated with ultrasound or CT scan in 3 to 6 months.   Full evaluation of the patient's back pain led to an MRI of his  lumbosacral spine.  This revealed evidence of acute T11 and T12  fractures.  Interventional radiology was asked to comment on these.  Dr.  Corliss Skains reviewed the patient's MRIs and did feel that she was  amendable to kyphoplasty.  After consent was accomplished from the  patient and her family, patient was taken to the interventional  radiology suite on June 06, 2008, at which time kyphoplasty was carried  out.  Patient received excellent results with a significant decrease in  her pain within 24 hours of her procedure.   The remainder of the hospital stay was spent assuring the patient's  ambulatory status and stability in ambulating were safe for her  discharge home.  Ultimately, the patient was certified safe for  discharge with care of her family.  It is anticipated that all home  health arrangements will be accomplished by June 10, 2008, and the  patient will be then cleared for discharge after receiving her  hemodialysis treatment in the hospital prior to her discharge.      Lonia Blood, M.D.  Electronically Signed     JTM/MEDQ  D:  06/09/2008  T:  06/09/2008  Job:  [id number]   cc:   [facility]

## 2010-07-14 NOTE — H&P (Signed)
[name], [name]            ACCOUNT NO.:  [id number]   MEDICAL RECORD NO.:  [id number]          PATIENT TYPE:  INP   LOCATION:  2103                         FACILITY:  [facility]   PHYSICIAN:  Vania Rea, M.D. DATE OF BIRTH:  Jun 30, 1919   DATE OF ADMISSION:  01/10/2008  DATE OF DISCHARGE:                              HISTORY & PHYSICAL   PRIMARY CARE PHYSICIAN:  Dr. Tanya Nones at [facility].   NEPHROLOGIST:  Dr. Darrick Penna and Dr. Briant Cedar at [facility].   CHIEF COMPLAINT:  Bight red blood per rectum since after dinner.   HISTORY OF PRESENT ILLNESS:  This is an 77 year old African-American  lady with a history of hypertensive end-stage renal disease, dialysis  dependent,  who usually ambulates with the assistance of a four-pronged  cane and lives with her daughter.  After supper yesterday evening, the  patient felt gaseous and got up to use the bathroom, but in the toilet  bowl saw formed stool mixed with bright red blood.  This happened twice  at home, and then family brought the patient to the emergency room where  she was evaluated by the emergency room physician and bright red blood  confirmed.  The patient has had no dizziness or falling.  She did have a  sudden drop of her systolic blood pressure to 58 in the emergency room  which responded to a 500 cc bolus, and later family reported that her  blood pressure sometimes fluctuated like that.  She was not at any time  symptomatic.   The patient has not been using any NSAIDS but does take Aggrenox one  daily because of a past history of multiple posterior circulation  strokes.   The patient has never had a colonoscopy.  She did many years ago have  what sounds like a sigmoidoscopy but does not remember being told of any  pathology.  She has no recollection of being diagnosed with  diverticulitis or any other gastrointestinal problems.   She has a sister who died in her mid 56s from  colon cancer which was  diagnosed a few years earlier.   PAST MEDICAL HISTORY:  1. End-stage renal disease, dialysis dependent, Mondays, Wednesdays,      Fridays.  2. History of multiple acute posterior circulation infarcts.  3. History of hypertension.  4. Glaucoma.  5. History of retinal disease status post central retinal venous      occlusion in 1985.  6. Cystoid macular edema.   MEDICATIONS:  1. Aggrenox one daily.  2. Nephro-Vite one daily.  3. Nexium 40 mg daily.  4. PhosLo 677 mg one daily.  5. Zyrtec 10 mg daily.  6. Xalatan eye drops 1 drop both eyes at bedtime.  7. Trusopt 2% one drop both eyes twice daily.  8. Betimol 0.5% one drop to both eyes twice daily.   ALLERGIES:  NO KNOWN DRUG ALLERGIES.   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  Now she is elderly,  does not do very much.  No history of tobacco, alcohol or illicit drug  use.   FAMILY HISTORY:  Significant for hypertension, coronary artery disease  in her mother and a sister with colon cancer as noted above.   REVIEW OF SYSTEMS:  Other than noted above, a 10-point review of systems  is unremarkable.  The patient denies chest pain, shortness of breath or  palpitations.   PHYSICAL EXAMINATION:  GENERAL:  A small-framed elderly African-American  lying in the stretcher in no acute distress.  VITAL SIGNS:  Her temperature is 97.4, pulse is 80.  Her blood pressure  has varied between 130/60 to a low of 54/32.  When last checked, it was  89/51.  Her respiratory rate varies between 18 and 24.  She does not  appear to be in any respiratory distress.  She is in no pain.  She is  saturating at 96% on room air.  HEENT:  Pupils are round.  They are nonreactive and surgical.  Her  mucous membranes are pink.  No cervical lymphadenopathy or thyromegaly.  CHEST:  She has occasional rhonchi.  CARDIOVASCULAR:  Regular rhythm with a 3/6 systolic murmur.  ABDOMEN:  Soft and nontender.  She has increased bowel  sounds.  EXTREMITIES:  Without edema.  She has 2+ pulses bilaterally.  CENTRAL NERVOUS SYSTEM:  Cranial nerves III-XII are grossly intact, and  she has no lateralizing signs.   LABORATORY DATA:  Her white count is 8.1, hemoglobin 11.2, platelets  341.  She has a normal differential.  Her PT is 12.8, INR 1.0, PTT 31.  Sodium is 136, potassium 3.4, chloride 100, CO2 of 26, glucose 128, BUN  23, creatinine 6.0.  Her calcium is 8.4, total protein 5.8, albumin 2.9.  Liver function tests are completely normal.  Abdominal series shows  nonobstructive bowel gas pattern, no evidence of free air, emphysema,  but no acute findings in the chest.   ASSESSMENT:  Lower gastrointestinal bleed, possibly due to diverticular  disease, possibly due to hemorrhoids.  Labile Blood pressure of unclear etiology  End-stage renal disease   PLAN:  Will admit this lady to step-down.  Will monitor her hemoglobin  every 4-6 hours and transfuse as necessary.  Agree with consulting the  gastroenterologist, which has already been done.  The patient is  scheduled for dialysis in the morning, and the nephrologists have  already been contacted.  Other plans as per orders.      Vania Rea, M.D.  Electronically Signed     LC/MEDQ  D:  01/10/2008  T:  01/10/2008  Job:  [id number]   cc:   [facility]  Shirley Friar, MD

## 2010-07-14 NOTE — Discharge Summary (Signed)
[name], [name]            ACCOUNT NO.:  [id number]   MEDICAL RECORD NO.:  [id number]          PATIENT TYPE:  INP   LOCATION:  6531                         FACILITY:  [facility]   PHYSICIAN:  Altha Harm, MDDATE OF BIRTH:  Mar 04, 1919   DATE OF ADMISSION:  01/09/2008  DATE OF DISCHARGE:                               DISCHARGE SUMMARY   FINAL DIAGNOSES:  1. Gastrointestinal bleeding.  2. Diverticulosis.  3. Acute blood loss anemia.  4. End-stage renal disease.  5. History of hypertension.  6. History of glaucoma.  7. Retinal disease status post retinal veil occlusion in 1985.   DISCHARGE MEDICATIONS:  To be done at the time of discharge.   CONSULTANTS:  1. Dr. Bosie Clos, gastroenterology.  2. [facility].   PROCEDURES:  1. Mesenteric angiogram which shows no evidence of active contrast,      extravasation, or bleeding.  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  2. Colonoscopy which shows extensive diverticulitis, greater on the      left than the right, with bloody fluid in the left colon with      multiple colon polyps not addressed on this procedure.  Suspected      bleeding was from diverticulosis probably from the left colon.   DIAGNOSTIC STUDIES:  1. CT abdomen and pelvis with contrast which shows grossly normal      appearance of the gallbladder.  2. Atrophic kidneys bilaterally consistent with end-stage renal      disease.  3. Age indeterminate L3 superior endplate compression fraction.  4. Diverticular disease without evidence of diverticulitis.  5. Gaseous distention of the rectum.  6. Tagged RBC blood scan which shows no evidence of an active GI bleed      done on January 10, 2008.  7. Repeat RBC tagged blood scan on January 13, 2008, which shows      finding consistent with an active gastrointestinal bleeding with      origin likeliness in the sigmoid colon.   ALLERGIES:  ASPIRIN.   CODE  STATUS:  Full code.   CHIEF COMPLAINT:  Bright red blood per rectum.   HISTORY OF PRESENT ILLNESS:  Please refer to the H and P dictated by Dr.  Orvan Falconer for details of the HPI.   HOSPITAL COURSE:  1. Gastrointestinal bleeding.  The patient was admitted with bright      red blood per rectum.  The patient continued to have bleeding      intermittently throughout her hospital stay up until this point and      required up to 12 units of packed red blood cells up until present.      The patient had several studies done including a tagged RBC scan      which initially did not show any bleeding, however, on a repeat RBC      scan it showed findings consistent with diverticular bleeding.  A      colonic angiogram was done which was essentially nonrevealing and      showed a possibility of abnormality in  the superior mesenteric      artery is part of her aneurysm, however, no evidence of active      bleeding.  The patient has continued to bleed and surgery was      consulted to see the patient for possible colectomy.  The patient      has been nebulous in terms of her agreement to proceeding with the      colectomy and the surgeons have deferred any surgical intervention      to further observe the patient.  However, at this point, if the      patient has any further bleeding I think that the colectomy is      indicated.  The patient, although it is not her desire to have a      colectomy, is willing to consent to the colectomy if it is      necessary.  The family has some concern that she may have bleeding      from another source, however, I spoke with the daughter, Suzette Battiest,      and informed her that she has no extra GI bleeding within the      abdominal pelvic cavity and the bleeding has been contained within      the gastrointestinal system.  There was some suggestion of vaginal      bleeding, however, the patient has not exhibited any vaginal      bleeding and the bleeding is certainly  coming from the      gastrointestinal area.  2. Acute blood loss anemia.  This is certainly secondary to her GI      bleed, see above.  3. End-stage renal disease.  The patient has continued on her dialysis      without any difficulty.  4. The patient had been on Plavix secondary to her history of retinal      artery occlusion, however, at this point she has had to be off of      the Plavix because of the bleeding and is at risk for reocclusion      certainly while she is off the Plavix.  Otherwise, the patient has      remained stable.  She does have mild tachycardia likely secondary      to her volume loss with the bleeding, however, this brings her up      to date on her hospital course up until this point.      Altha Harm, MD  Electronically Signed     MAM/MEDQ  D:  01/16/2008  T:  01/16/2008  Job:  [id number]

## 2010-07-14 NOTE — H&P (Signed)
NAMEJENNAVIE, [name]            ACCOUNT NO.:  [id number]   MEDICAL RECORD NO.:  [id number]          PATIENT TYPE:  INP   LOCATION:  6739                         FACILITY:  [facility]   PHYSICIAN:  Isidor Holts, M.D.  DATE OF BIRTH:  [birth year]   DATE OF ADMISSION:  08/22/2008  DATE OF DISCHARGE:                              HISTORY & PHYSICAL   PRIMARY CARE PHYSICIAN:  Dr. Gilmore Laroche, [facility].   PRIMARY NEPHROLOGIST:  Dr. Darrick Penna.   CHIEF COMPLAINT:  Progressive weakness and functional decline for  approximately 2 weeks.  Also, increased shortness of breath on exertion,  particularly in the last week.   HISTORY OF PRESENT ILLNESS:  This is an 77 year old female.  For past  medical history, see below.  History is supplied by the patient's  daughter [name], and her son-in-law Mr. [name], who are both  present in the emergency department, as the patient is a poor historian.  According to them, the patient, over the past 2 weeks, appeared to be  gradually declining from her functional viewpoint and is progressively  weaker.  Over the past one week, she has become increasingly short of  breath on exertion, and now is unable to walk any distance at all.  She  has also, over the past 2 weeks, complained of back pain, but has  sustained no falls.  She has no history of chest pain, coughing, fever,  vomiting or diarrhea.   PAST MEDICAL HISTORY:  1. T11-T12 compression fracture, status post kyphoplasty on April      2010.  2. History of pelvic cystic fluid collection April 2010, status post      needle aspiration, which showed benign pathology.  3. End-stage renal disease, on hemodialysis Mondays, Wednesdays and      Fridays.  4. Secondary hyperparathyroidism.  5. COPD.  6. Hypertension.  7. Anemia of chronic disease.  8. Glaucoma.  9. History of severe diverticular disease, status post total colectomy      and ileostomy December 2009.  10.GERD.  11.History of TIA.   MEDICATION HISTORY:  1. Nephro-Vite one p.o. daily.  2. ProMod protein powder with each meal.  3. Megace suspension 800 mg p.o. daily.  4. Betimol eye drops 0.5% one drop each eye b.i.d.  5. Trusopt 2% eye drops one drop each eye b.i.d.  6. Xalatan 0.05% eye drops one drop each eye q.h.s.  7. Nexium 40 mg p.o. daily.  8. Aggrenox one p.o. b.i.d.  9. Aranesp 25 mcg IV every Tuesday at hemodialysis.  10.Neutra-Phos one packet p.o. after hemodialysis.   ALLERGIES:  NO KNOWN DRUG ALLERGIES.   REVIEW OF SYSTEMS:  As per HPI and chief complaint, otherwise, 10-point  review of systems is negative.   SOCIAL HISTORY:  The patient resides with her daughter and son-in-law.  She is a nonsmoker, although in the past, she was exposed to passive  smoking, nondrinker.  Has no history of drug abuse.   FAMILY HISTORY:  The patient had five offspring, four now living,  otherwise noncontributory.   PHYSICAL EXAMINATION:  VITAL SIGNS: Temperature 96.6, pulse 93  per  minute, regular, respiratory rate 20, BP 104/56 mmHg, pulse oximeter 99%  on room air.  GENERAL:  The patient did not appear to be in obvious acute distress at  time of this evaluation, and as a matter of fact, she was sitting up in  her bed in the emergency department, eating dinner.  Certainly, did not  appear short of breath at rest.  HEENT:  No clinical pallor or jaundice.  No conjunctival injection.  Throat clear.  NECK:  Supple.  JVP not seen.  No palpable lymphadenopathy.  No palpable  goiter.  CHEST:  Clinically clear to auscultation.  No wheezes or crackles.  HEART:  Sounds 1 and 2 heard, normal, regular, no murmurs.  ABDOMEN:  Full, soft and nontender.  No palpable organomegaly or  palpable masses.  Normal bowel sounds.  Lower extremity examination, no  pitting edema.  Palpable peripheral pulses.  MUSCULOSKELETAL:  Osteoarthritic changes noted.  CENTRAL NERVOUS SYSTEM:  No focal neurologic deficit on  gross  examination.   INVESTIGATIONS:  CBC:  WBC 15.40, neutrophils 82%, hemoglobin 15.8,  hematocrit 47.9, platelets 257.  Electrolytes sodium 138, potassium 4.0,  chloride 97, CO2 28, BUN 24, creatinine 5.40, glucose 123, alkaline  phosphatase 83, AST 39, ALT 28, albumin 3.9, calcium 10.7.  Troponin I  at point of care less than 0.05.  Chest x-ray done August 22, 2008, she  has stable hyperaeration.  No consolidation.  Chest CT angiogram done  August 22, 2008 showed no pulmonary embolism.  No active disease, COPD.   ASSESSMENT AND PLAN:  1. Progressive weakness/debility and shortness of breath on exertion.      Pneumonia and pulmonary embolism have been ruled out, as has been      fluid overload.  We shall arrange 2-D echocardiogram to rule out      cardiomyopathy.   1. Leukocytosis, source unclear.  The patient has no obvious pyrexia,      but it is important to rule out sepsis in this patient with end-      stage renal disease on hemodialysis.  We shall therefore send blood      cultures, but commence the patient empirically on a combination of      Vancomycin and Fortaz.   1. COPD.  This appears stable clinically.  We shall utilize      bronchodilators as needed.   1. Back pain.  This may be contributory to her functional decline, on      the basis of a possible new compression fracture.  We shall do x-      ray of lumbar and thoracic spine.  Meanwhile, utilize analgesics      and consult PT/OT.   1. End-stage renal disease on hemodialysis.  This will be continued      under the auspices of renal team.  We shall inform Renal team of      this admission.   Note:  I have discussed code status with the patient's daughter,  [name], telephone number [phone], and her spouse Mr.  [name], telephone number [phone], and the patient is full code.  We  shall respect this status during the course of this hospitalization.      Isidor Holts, M.D.  Electronically  Signed     CO/MEDQ  D:  08/23/2008  T:  08/23/2008  Job:  [id number]   cc:   Broadus John T. Pamalee Leyden, MD  Llana Aliment Deterding, M.D.

## 2010-07-14 NOTE — H&P (Signed)
[name], [name]            ACCOUNT NO.:  [id number]   MEDICAL RECORD NO.:  [id number]          PATIENT TYPE:  INP   LOCATION:  3702                         FACILITY:  [facility]   PHYSICIAN:  Carlena Hurl, MDDATE OF BIRTH:  Feb 05, 1920   DATE OF ADMISSION:  04/18/2008  DATE OF DISCHARGE:                              HISTORY & PHYSICAL   CHIEF COMPLAINT:  The patient is brought here because of lethargy and  weakness and altered mental status.   HISTORY OF PRESENT ILLNESS:  This is an 77 year old African American  female who has a past medical history significant for end-stage renal  disease on hemodialysis and history of diverticular bleed with status  post subtotal colectomy and was brought in by her family from rehab  because of lethargy, weakness, and altered mental status.  The history  is obtained from the patient's daughter at the bedside.  The patient is  alert and oriented, but she was not able to give the whole history  because of lethargy and weakness.  According to her daughter, the  patient was in [place] during November 2009 for small bowel  obstruction and was found to have incarcerated small bowel and  parastomal hernia during which time she underwent subtotal colectomy  with end ileostomy and cholecystectomy and also primary repair of  ventral incisional hernia.  After the surgery, the patient was sent to  rehab, and she stayed in rehab since the discharge from this hospital  during November of last year.  On April 15, 2008, the patient was  sent here from rehab because of lethargy, weakness, and was found to  have hyperkalemia and high serum-creatinine and she underwent emergent  hemodialysis and she was also found to have leukocytosis and was started  on antibiotic therapy.  She was discharged on April 17, 2008, that  was on Wednesday, day before yesterday to rehab.  The patient daughter  says that she was pretty much in the same condition as of  now when she  was discharged last time.  As she was still feeling little weak and  lethargic and not able to eat, they have decided to bring her back here.  This patient denies having any chest pain, trouble breathing, no nausea,  no vomiting, no abdominal pain, no headache or dizziness.   PAST MEDICAL HISTORY:  Significant for end-stage renal disease on  hemodialysis, Monday, Wednesday, and Friday.  The patient also has,  1. History of TIA.  2. History of diverticular bleed with status post subtotal colectomy      and ileostomy in November 2009.  3. History of ventral repair hernia.  4. History of central retinal vein occlusion.  5. History of glaucoma and TIA.   PAST SURGICAL HISTORY:  1. Subtotal colectomy with ileostomy.  2. Cholecystectomy.  3. Ventral hernia repair.   SOCIAL HISTORY:  The patient resides in the nursing home.  Denies  alcohol or IV drug abuse.   MEDICATIONS:  This patient is on Crestor, Nephro, ProMod, Megace,  PhosLo, Betimol, Trusopt, Xalatan, Zyrtec, Aggrenox, and Nexium.   FAMILY HISTORY:  Not significant.  REVIEW OF SYSTEMS:  Pretty much as in the history of present illness.   PHYSICAL EXAMINATION:  GENERAL:  A 77 year old lady who looks emaciated  and lying comfortably on the bed without any acute chest pain, and  trouble breathing.  VITALS:  When she came into the ER, her blood pressure of 142/75, pulse  rate of 124, respirations 18, temperature 98.1, and saturating 98% on 2  L.  HEENT:  Head is atraumatic, normocephalic.  Pupils, equal and reactive.  NECK:  Supple.  No JVD appreciated.  No goiter noted.  LUNGS:  Clear to auscultation bilaterally.  CVS:  S1 and S2 heard with a regular rate and rhythm.  ABDOMEN:  Soft.  Bowel sounds present and there is ileostomy present.  EXTREMITIES:  No pedal edema noted.  Pulses are palpable bilaterally.   LABORATORY DATA:  WBC 18.7, which was 20.3, that was on April 16, 2008, two days ago and  hemoglobin of 14.4, platelets of 266, neutrophils  of 16.1.  Sodium 134, potassium 4.8, chloride 92, bicarb 26, glucose  128, BUN of 60, creatinine of 7.8, which was 3.98 on April 17, 2008,  after she underwent a hemodialysis.  AST, ALT, and alk phos within  normal limits, albumin of 3.3.  First set of troponin and CK-MB within  normal limit.  BNP of 220, and the patient had a portable chest x-ray  done here, which showed no significant changes from the previous one.   ASSESSMENT AND PLAN:  This is a 77 year old African American lady with a  history of end-stage renal disease on hemodialysis and was recently  discharged 2 days ago.  Now, coming in with lethargy, weakness, and also  altered mental status.  1. Altered mental status.  At this time, the patient appears to be      more alert, awake, and oriented x3 and according to the patient's      family, her mental status was altered when they saw her in the      rehab.  This could be secondary to uremia and the patient is due to      get the hemodialysis yesterday but because she is here she did not      get one.  So we will go ahead and schedule hemodialysis for her      while in the hospital and also check her potassium as well as serum-      creatinine.  2. End-stage renal disease on hemodialysis.  We will schedule her to      get on hemodialysis while the patient is in the hospital.  3. History of diverticular bleed, status post end stage, subtotal      colectomy with ileostomy so far it looks stable and we will monitor      her.  4. History of transient ischemic attack, stable.  5. Leukocytosis, so far no source of infection and we are going to get      the urinalysis to rule out urinary infection and also get blood      cultures on this lady.  The patient denies having any fever, and      she is afebrile while here in the hospital.  She had a leukocytosis      while she was admitted recently and when compared to that her WBC       has been improving.  DVT prophylaxis because of her recent      diverticular bleed, we will place  her on SCDs.   DISPOSITION:  We will get a Child psychotherapist consult and get this patient  PT/OT.      Carlena Hurl, MD  Electronically Signed     JD/MEDQ  D:  04/19/2008  T:  04/19/2008  Job:  [id number]

## 2010-07-14 NOTE — Discharge Summary (Signed)
NAMEDOREATHER, HOXWORTH            ACCOUNT NO.:  [id number]   MEDICAL RECORD NO.:  [id number]          PATIENT TYPE:  INP   LOCATION:  6737                         FACILITY:  [facility]   PHYSICIAN:  Lonia Blood, M.D.DATE OF BIRTH:  Jul 14, 1919   DATE OF ADMISSION:  06/03/2008  DATE OF DISCHARGE:  06/10/2008                               DISCHARGE SUMMARY   ADDENDUM   PRIMARY CARE PHYSICIAN:  [facility].   DISCHARGE MEDICATIONS:  1. Questran 4 g p.o. b.i.d.  2. Nephro-Vite 1 p.o. daily  3. ProMod protein powder added to each meal.  4. Megace 800 mg p.o. daily.  5. Betimol 0.5% ophthalmic solution 1 drop in each eye b.i.d.  6. Trusopt 2% ophthalmic solution 1 drop in each eye b.i.d.  7. Xalatan 0.05% solution 1 drop in each eye every night.  8. Protonix 40 mg p.o. daily.  9. Imodium p.r.n. severe diarrhea.  10.Aranesp 25 mcg every Tuesday at dialysis.  11.Aggrenox 1 capsule b.i.d.  12.Neutra-Phos 1 packet p.o. x1 after hemodialysis.   FOLLOW UP:  Follow-up instructions have not changed.   HOSPITAL COURSE:  Hospital course is as previously dictated.      Lonia Blood, M.D.  Electronically Signed     JTM/MEDQ  D:  06/10/2008  T:  06/10/2008  Job:  [id number]   cc:   [facility]

## 2010-07-14 NOTE — Consult Note (Signed)
[name], SPURLING            ACCOUNT NO.:  [id number]   MEDICAL RECORD NO.:  [id number]          PATIENT TYPE:  INP   LOCATION:  2103                         FACILITY:  [facility]   PHYSICIAN:  Shirley Friar, MDDATE OF BIRTH:  [birth year]   DATE OF CONSULTATION:  DATE OF DISCHARGE:                                 CONSULTATION   REQUESTING PHYSICIAN:  Dr. Orvan Falconer.   REASON FOR CONSULTATION:  Rectal bleeding.   HISTORY OF PRESENT ILLNESS:  [name] is a pleasant 77 year old  black female with end-stage renal disease, on hemodialysis Monday,  Wednesday, and Friday who presents with acute onset of rectal bleeding  described as bright red blood mixed with formed stool.  She had 2  episodes of this pain with rectal bleeding tonight.  She denies any  associated nausea, vomiting, abdominal pain, melena, dizziness, or  lightheadedness.  There is no history of NSAID use.  She does take  Aggrenox chronically secondary to mini-strokes.  She has never had a  colonoscopy, but family thinks she has had a sigmoidoscopy years ago.  There is no known history of diverticulosis.   PAST MEDICAL HISTORY:  1. End-stage renal disease, on dialysis.  2. Transient ischemic attacks.  3. History of gastroesophageal reflux disease.   MEDICATIONS:  1. Aggrenox.  2. Nephro-Vite.  3. Nexium.  4. Cetirizine.  5. PhosLo.  6. Betimol.  7. Trusopt.  8. Xalatan.  9. Zyrtec.   ALLERGIES:  No known drug allergies.   FAMILY HISTORY:  Noncontributory.   SOCIAL HISTORY:  Denies cigarettes or alcohol.   REVIEW OF SYSTEMS:  Negative except as stated above.   PHYSICAL EXAMINATION:  VITAL SIGNS:  Temperature 97.4, blood pressure  120/62 (on presentation), blood pressure dropped into the 50 systolic  during her ER evaluation, pulse 107 on presentation, and O2 sats 98% on  room air.  GENERAL:  Alert in no acute distress.  ABDOMEN:  Mildly distended abdomen that is tympanic, soft, nontender,  and  positive bowel sounds.   LABORATORY DATA:  White blood count 8.1, hemoglobin 11.2, hematocrit 34,  INR 1.0, BUN 23, and creatinine 6.0.  T-bili 0.3, ALP 42, AST 21, ALT  15, and potassium 3.4.  All other labs as listed in hospital record.   IMPRESSION:  An 77 year old black female presenting with acute onset of  bright red blood per rectum, most likely due to a diverticular bleed.  The patient was normotensive on presentation in the emergency room, but  she did drop her pressures briefly to systolic in the 50s.  This episode  responded quickly to IV fluids.  She had large amount of bright red  blood on her diaper pad after this hypotensive episode.  I do not think  this is an upper source.  I think this is a lower gastrointestinal bleed  most likely due to diverticular bleeding.  Other possibilities include  ischemic colitis and malignancy is less likely.  Hemorrhoids is also  possible as well.  We recommend supportive care with IV fluids and blood  transfusion as needed.  If the patient continues to have rectal  bleeding, we will need to do a colonoscopy during this hospitalization  to further evaluate.  If her bleeding subsides and her blood count stays  stable, she may not need a colonoscopy.  Decision on colonoscopy will  depend on whether her bleeding persists or not, as well as any changes  in her hemodynamics.  Dr. Madilyn Fireman will follow up in the a.m.      Shirley Friar, MD  Electronically Signed     VCS/MEDQ  D:  01/10/2008  T:  01/10/2008  Job:  [id number]   cc:   Sanjuana Mae, MD

## 2010-07-14 NOTE — Consult Note (Signed)
NAMEADITRI, LOUISCHARLES            ACCOUNT NO.:  [id number]   MEDICAL RECORD NO.:  [id number]          PATIENT TYPE:  INP   LOCATION:  6531                         FACILITY:  [facility]   PHYSICIAN:  Ardeth Sportsman, MD     DATE OF BIRTH:  1919-11-29   DATE OF CONSULTATION:  DATE OF DISCHARGE:                                 CONSULTATION   REQUESTING PHYSICIAN:  John C. Madilyn Fireman, MD   PRIMARY CARE PHYSICIAN:  Priscille Heidelberg. Pamalee Leyden, MD, [facility].   NEPHROLOGIST:  Dyke Maes, MD, [facility].  James L. Deterding, MD, [facility].   INTERVENTIONAL RADIOLOGIST:  Rachelle Hora. Henn, MD   REASON FOR CONSULT:  Persistent lower GI bleeding most likely from  diverticula.   HISTORY OF PRESENT ILLNESS:  Ms. Boullion is an 77 year old female with  end-stage renal disease, hemodialysis has been going on for some years.  She also has history of multiple anterior-posterior circulation  infarcts/strokes, on chronic Aggrenox.  She notes that she started  having a bright red blood per rectum on January 09, 2008.  She was  admitted on the January 10, 2008.  She has been receiving intermittent  blood transfusion since that time.  She is on her 9th unit right now.  She was admitted in medical service.  She has been getting dialysis.  She has undergone evaluation with a tagged-RBC scan, was done earlier  which was negative.  She had a colonoscopy done by Dr. Madilyn Fireman which  showed a lot of blood in the colon and rectum up to the splenic flexure  and then clear more proximally to the cecum although some incidental  numerous small polyps noted in the right colon.  She has never had a  colonoscopy before.  It is more suspicious of the numerous diverticula  in the sigmoid region.  It seemed to come down after that.  She sees  some more blood.  However, she started rebleeding and repeat CAT scan,  as long as it is consistent with the sigmoid location.  I have  discussed  these findings with Dr. Lowella Dandy and radiologist as well.   Dr. Madilyn Fireman requested surgical consultation since she has persistent  bleeding and has not been able to be well controlled.   The patient denies any major history or any other GI or bowel problems  in the past.  She has never recollection of diverticula, other GI  problems or history of inflammatory disease, irritable bowel syndrome,  any prior hematemesis or hematochezia.   PAST MEDICAL HISTORY:  1. End-stage renal disease, dialysis every Monday, Wednesday, and      Friday.  2. Multiple acute posterior circulation infarct (TIA), on chronic      Aggrenox.  3. Hypertension.  4. Glaucoma.  5. Retinal disease, status post central retinal venous occlusion in      1985.  6. Cystoid macular edema.   MEDICATIONS:  At home include:  1. Aggrenox.  2. Nephro-Vite.  3. Nexium.  4. PhosLo.  5. Zyrtec.  6. Xalatan.  7. Trusopt.  8. Betimol.  ALLERGIES:  None.   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  No history of tobacco, alcohol,  or drug use.   FAMILY HISTORY:  Coronary disease and diabetes in her mother and sister  with colon cancers noted above interestingly.   REVIEW OF SYSTEMS:  Noted as per HPI.  Denies any fever, chills, or  sweats.  No nausea or vomiting.  No weight gain or weight loss.  Cardiac  and respiratory is negative.  Eyes:  She does wear glasses.  Her visions  have been stable.  No recent changes.  ENT is negative.  Neurological,  psychological, dermatologic, heme/lymph, allergic otherwise negative.  GI as noted above.  I think she has some mild constipation normally.  No  history of any other NSAID use.  No history of melena.  No dizziness or  lightheadedness.  No nausea or vomiting.  GYN:  She is status post  hysterectomy.  GE is negative.  GU:  She is oliguric.   PAST SURGICAL HISTORY:  She, what sounds like she had a  hysterectomy  after her third birth, secondary to stillborn.  She has not recalled,  she has had an appendectomy.  She does not recall, she has had any other  intraabdominal surgery.  She has had numerous shunt and AV fistulae for  chronic renal failure since 2006 at least.   PHYSICAL EXAMINATION:  VITAL SIGNS:  Her systolic blood pressure has  been running 114-130s; pulse 87-110; temperature, T max of 98.5,  currently at 97.9.  She is sating 99% on 2 L of nasal cannula.  GENERAL:  In general, she is a well developed, well nourished, thin  female close to her ideal body weight, in no acute distress.  PSYCH:  She is pleasant and interactive.  No strong evidence of any  dementia, delirium, psychosis, or paranoia.  She does ramble slightly  but is certainly appropriate.  NEUROLOGICAL:  Cranial II through XII are intact.  Hand grip 5/5, equal,  and symmetrical.  EYES:  She does wear glasses.  Her vision has decreased somewhat but no  focal deficit.  Extraocular movements are intact.  Pupils are equal,  round, and reactive to light.  Sclerae nonicteric and noninjected.  NECK:  Supple without masses.  Trachea is midline.  HEENT:  She is normocephalic with mucous membranes are dry, but  nasopharynx and oropharynx are clear.  HEART:  Regular rate and rhythm.  No murmurs, gallops, or rubs.  Slightly tachycardiac.  CHEST:  Clear to auscultation bilaterally.  No wheezes, rales, or  rhonchi.  BREAST:  No obvious masses or nipple discharge.  ABDOMEN:  Soft with a well-healed midline incision.  It is nondistended.  She has had some mild discomfort in the left lower quadrant on one exam  but after that she was fine.  No evidence of any peritonitis.  GENITOURINARY:  Normal external female genitalia.  RECTAL:  Deferred especially given recent colonoscopy.  EXTREMITIES:  There is mild 1+ edema bilateral extremities with no major  clubbing or cyanosis.  MUSCULOSKELETAL:  Normal range of motion at the  shoulder, elbow, wrist,  hips, knees, and ankles.  LYMPH:  No head, neck, axillary, or groin lymphadenopathy.  SKIN:  No petechiae or purpura.  No major sores or lesions.   LABORATORY EVALUATION:  She has had hemoglobin that is intermittently  dipped down, required numerous transfusions, again she is on  unit 9.  Most recent hemoglobin was 6.5 this morning.  It has been high as 10.3,  but is persistently dropped over the last 24 hours.  Her potassium is  3.5, her creatinine is anywhere from 2.2-2.6, currently at 3.3.  Studies  again are noted in per HPI with the initial tagged scan negative.  Repeat today consistent with bleeding in the sigmoid colon.  Colonoscopy  showing blood in the left side of the colon from the splenic flexure  distally with the high concentration diverticula and sigmoid colon and  numerous small benign-appearing polyps in the right colon.   ASSESSMENT AND PLAN:  An 77 year old female with needing chronic  anticoagulation with strokes and renal failure and hypertension with  recurrent GI bleeding.  1. Discuss the case with Dr. Richarda Overlie with Interventional Radiology.      Our concern is that given her elderly age and a narrow IMA, it is      challenging to get control of an arteriogram, but we will attempt      for this given her elevated operative risk with her numerous      comorbidities, and see if this can be controlled through      arteriogram only.   If that is refractory, then I will recommend that she have segmental  colonic resection most likely of her sigmoid vs abdominal colectomy.  The technique of excision discussed.  The risk, benefits, and  alternatives were discussed.  If she is stable, I will try and do this  laparoscopically-assisted technique, but if she seems labile, I will do  it through an open incision.  If there is a fair probability that she  will require an ostomy since she has elevated anastomotic risk with her  anemia, renal failure,  etc.  However, if she seems medically stable and  the case goes well, I might try and do that with maybe a proximal  diverting ileostomy which would be a little less morbid for her.  Technique of the procedure was discussed in detail and indeed she agrees  to proceed.   Colon polyp.  I agreed to Dr. Madilyn Fireman that once she gets through this  transitory period, then she can have an elective colonoscopy for  polypectomy and if that there are concerning lesions then may be need a  completion of abdominal colectomy.  However, that is a little more  morbid and would like to avoid that if we can.      Ardeth Sportsman, MD  Electronically Signed     SCG/MEDQ  D:  01/13/2008  T:  01/14/2008  Job:  [id number]   cc:   Everardo All. Madilyn Fireman, M.D.  Broadus John T. Pamalee Leyden, MD  Dyke Maes, M.D.  James L. Deterding, M.D.  Arn Medal, MD

## 2010-07-14 NOTE — Consult Note (Signed)
NAMEJERNI, Shelley            ACCOUNT NO.:  [id number]   MEDICAL RECORD NO.:  [id number]          PATIENT TYPE:  OUT   LOCATION:  XRAY                         FACILITY:  [facility]   PHYSICIAN:  Sanjeev K. Deveshwar, M.D.DATE OF BIRTH:  September 02, 1919   DATE OF CONSULTATION:  10/01/2008  DATE OF DISCHARGE:  10/01/2008                                 CONSULTATION   CHIEF COMPLAINT:  Back pain with history of multiple compression  fractures.   BRIEF HISTORY:  This is a very pleasant 77 year old female, well known  to Dr. Corliss Skains.  She recently had a kyphoplasty at the L1 level,  performed on August 28, 2008.  She has had previous kyphoplasties at T11  and T12, performed on June 06, 2008.  She has an old fracture at L3 that  did not require treatment.  She presents today accompanied by her son-in-  law who is her primary caregiver for further evaluation of back pain.   PAST MEDICAL HISTORY:  Significant for:  1. Hypertension.  2. COPD.  3. Gastroesophageal reflux disease.  4. End-stage renal disease, followed by Dr. Darrick Penna.  5. She has a history of a GI bleed with diverticular disease.  6. She has glaucoma.  7. She has a history of TIAs.  8. She has hyperparathyroidism.  9. She has a history of aluminum toxicity.   PAST SURGICAL HISTORY:  Significant for a colectomy with colostomy in  November 2009.   ALLERGIES:  ASPIRIN causes stomach upset.   CURRENT MEDICATIONS:  The patient and her son-in-law did not bring a  list of her medications.  She was discharged from [facility] on  August 22, 2008.  At that time, her medications consisted of Megace,  Aggrenox, Xalatan eyedrops, Trusopt eyedrops, Betimol eyedrops, Nephro-  Vite, and Nexium.  The patient's son-in-law does not feel that her  medications have changed significantly since that time, although he does  believe she is on a new pain medication.   SOCIAL HISTORY:  The patient is widowed.  She had 6 children in total,  4  are still living.  She lives in [place] with her son-in-law.  She  does not use alcohol or tobacco.  She worked on a farm prior to  retirement.   FAMILY HISTORY:  Her mother died in her 34s from an MI.  Her father died  from a motor vehicle accident.   IMPRESSION AND PLAN:  The patient returns today to be seen in followup  by Dr. Corliss Skains after undergoing a kyphoplasty at the L1 level on August 28, 2008.  She had previously had a followup visit on September 19, 2008, and  was doing well at that time.  Last Thursday afternoon, she got up from  the sofa and hurt her back, popped.  She developed instant pain, which  she rated as a 10 on a 1-10 scale.  She is now wheelchair bound and is  unable to ambulate due to the pain.   Dr. Corliss Skains reviewed her previous images with the patient and the  patient's son-in-law.  He is concerned that the patient may  have  developed a new fracture.  She is in severe pain.  We have made  arrangements for an MRI today to further evaluate for a new fracture.  All of the patient's and her son-in-law's questions were answered.  Greater than 10 minutes was spent on this followup visit.  Further  recommendations will be made based on the results of the pending MRI  scan.      Delton See, P.A.    ______________________________  Grandville Silos. Corliss Skains, M.D.    DR/MEDQ  D:  10/01/2008  T:  10/02/2008  Job:  [id number]   cc:   Dr. Darrick Penna  Dr. Tanya Nones

## 2010-07-14 NOTE — Group Therapy Note (Signed)
NAMEBREXLEE, [name]            ACCOUNT NO.:  [id number]   MEDICAL RECORD NO.:  [id number]          PATIENT TYPE:  INP   LOCATION:  6741                         FACILITY:  [facility]   PHYSICIAN:  Peggye Pitt, M.D. DATE OF BIRTH:  August 07, 1919                                 PROGRESS NOTE   DATE OF DISCHARGE:  Still to be determined.   DIAGNOSES:  1. Acute diverticular bleed status post colectomy and ileostomy      postoperative day #11.  2. Peristomal hernia repair postoperative day #2.  3. Leukocytosis.  4. End-stage renal disease on hemodialysis.  5. Enterobacter UTI.  6. History of hypertension.   CURRENT MEDICATIONS:  1. Aranesp 100 mcg IV on Mondays with hemodialysis.  2. Trusopt eye drops, one drop in each eye twice daily.  3. Lovenox 30 mg subcu daily.  4. Xalatan eye drops one drop in each eye at bedtime.  5. Protonix 40 mg IV daily.  6. Zosyn to be discontinued today.  7. Vancomycin to be discontinued today.  8. Nephro-Vite one tablet p.o. daily.  9. Timolol eye drops one drop in each eye twice daily.  10.A variety of p.r.n. medications to include albuterol, Labetalol,      morphine, Zofran, Phenergan and Restoril.   HOSPITAL COURSE:  Up to date.  For details of prior to November 25, please refer to  previous excellent interim discharge summary by Dr. Marthann Schiller.  After that, the patient underwent a colectomy and ileostomy for her  acute diverticular bleed.  Her p.o.'s were slowly advanced.  However,  she developed massive bilious vomiting.  NG tube was placed.  She was  kept n.p.o.  A CT scan of her abdomen and pelvis was obtained that  showed a small bowel dilatation related to obstruction, a concern for  obstructive process related to the parastomal hernia.  With the results  of this, surgery was again consulted and they took the patient to the  operating room and repaired her hernia.  At present, the patient is now  taking clears.  She has had no  further emesis.  Plan is to slowly  advance her diet and also we need to figure out her disposition.  She is  very weak, so I suspect she may need a nursing facility upon discharge  versus discharge home with 24-hour family supervision.  This will need  to be discussed with both the daughter and a Child psychotherapist.  For end-  stage renal disease, nephrology has continued her dialysis.  For  enterobacter UTI, she has already completed a course of ciprofloxacin  for this.   Discharge diagnosis and actual discharge medications are to be addressed  by discharging physician.      Peggye Pitt, M.D.  Electronically Signed     EH/MEDQ  D:  01/30/2008  T:  01/30/2008  Job:  [id number]

## 2010-07-14 NOTE — Discharge Summary (Signed)
[name], [name]            ACCOUNT NO.:  [id number]   MEDICAL RECORD NO.:  [id number]          PATIENT TYPE:  INP   LOCATION:  6739                         FACILITY:  [facility]   PHYSICIAN:  Beckey Rutter, MD  DATE OF BIRTH:  Jul 02, 1919   DATE OF ADMISSION:  08/22/2008  DATE OF DISCHARGE:                               DISCHARGE SUMMARY   PRIMARY CARE PHYSICIAN:  Broadus John T. Pamalee Leyden, MD with [facility].   NEPHROLOGIST:  Dr. Darrick Penna.   CHIEF COMPLAINT:  Progressive weakness and functional decline for  approximately 2 weeks prior to admission.   HOSPITAL PROCEDURE AND IMAGING:  Chest x-ray on August 22, 2008,  impression is stable hyperaeration, no new consolidation.  CT angiogram  on August 22, 2008, impression is no evidence of pulmonary embolism or  other active disease.  COPD.  Lumbar x-ray impression is new mild  superior plate compression fracture of L1 since MRI of April 2006.  Thoracic spine x-ray showing mild superior endplate compression fracture  of L1 is new since the patient's June 04, 2008, MRI.   RECENT LABORATORY DATA:  Sodium 134, potassium 4.4, chloride 96, CO2 23,  BUN is 35.  White blood count is 9.6, platelet count is 238, hemoglobin  is 12.7, hematocrit 37.3.   HOSPITAL COURSE:  1. Weakness.  This is felt multifactorial.  The patient has end-stage      renal disease, heavy metal poisoning together with malnutrition      which is all felt contributing to the generalized weakness.  The      patient was started on Megace 400 mg p.o. daily to stimulate      appetite.  2. End-stage renal disease.  The patient was seen by nephrology      service and she was continued on hemodialysis.  It is worth noting      that the patient was scheduled to have Desferal with dialysis      yesterday and for some reason it was missed.  The patient is anuric      currently, and for that reason, I will not give the Desferal now      and should be given  with hemodialysis.  3. Malnutrition.  As discussed above, the patient was started on      Megace.  4. Generalized weakness.  This is secondary to failure to thrive.  The      patient will be discharged on home health physical therapy.   DISCHARGE MEDICATIONS:  1. Megace 400 mg p.o. daily.  2. Aggrenox 1 capsule b.i.d.  3. Xalatan 0.005 eye drops at bedtime.  4. Trusopt 2% both eyes b.i.d.  5. Betimol 0.5 mg 1 drop each eye b.i.d.  6. Nephro-Vite 1 daily.  7. Nexium 400 mg b.i.d.  8. Home health physical therapy.   DISCHARGE DIAGNOSES:  1. History of aluminum toxicity, on deferoxamine.  2. Failure to thrive with generalized weakness.  3. Dyspnea on exertion, felt secondary to chronic obstructive      pulmonary disease picture.  4. End-stage renal disease, on hemodialysis.  5. Anemia.  6. Osteodystrophy.  7. History of colectomy and partial ileostomy secondary to      diverticular disease and rupture.  8. Transient ischemic attacks.  9. Gastroesophageal reflux disease.  10.New compression fracture on L1.  Old L3 compression fracture.   DISCHARGE PLAN:  The patient was discharged to home health physical  therapy.  She was advised to follow up with her primary physician and  nephrologist. patient's daughter wanted kyphoplasy evalution prior to  her discharge. IR will evaluate before discharge.      Beckey Rutter, MD  Electronically Signed     EME/MEDQ  D:  08/27/2008  T:  08/28/2008  Job:  [id number]

## 2010-07-14 NOTE — Op Note (Signed)
NAMEHONESTEE, REVARD            ACCOUNT NO.:  [id number]   MEDICAL RECORD NO.:  [id number]          PATIENT TYPE:  AMB   LOCATION:  SDS                          FACILITY:  [facility]   PHYSICIAN:  Jillyn Hidden A. Rankin, M.D.   DATE OF BIRTH:  1919-03-26   DATE OF PROCEDURE:  08/16/2007  DATE OF DISCHARGE:  08/16/2007                               OPERATIVE REPORT   PREOPERATIVE DIAGNOSIS:  Dense vitreous hemorrhage, right eye.   POSTOPERATIVE DIAGNOSES:  1. Dense vitreous hemorrhage, right eye.  2. Old central retinal vein occlusion, right eye.  3. Nondiabetic proliferative retinopathy.   PROCEDURE:  Posterior vitrectomy with endolaser panphotocoagulation,  right eye - 25 gauge.   SURGEON:  Alford Highland. Rankin, MD   ANESTHESIA:  Local as per my anesthesia control.   INDICATIONS FOR PROCEDURE:  The patient is an 77 year old woman who has  impairment of activities of daily living and loss of ambulatory vision  in the right eye on the basis of dense vitreous hemorrhage on clearing.  The patient's family understand this is an attempt to remove the  vitreous hemorrhage which will allow and have some restoration of  peripheral and macroscopic vision, so called ambulatory vision.  She  understands that this will not improve the underlying retinal vein  occlusion issues, but it will regain ambulatory vision and prevent  progression to total vision loss.  She understands the risks of  anesthesia including the rare occurrence of death loss to the eye  including but not limited to hemorrhage, infection, scarring, need for  another surgery, no change in vision, loss of vision, and progression of  disease despite intervention.    After appropriate signed consent was obtained, the patient was taken to  the operating room.  In the operating room, appropriate monitors  followed by monitored anesthesia.  Xylocaine 2% was injected retrobulbar  5 mL with additional 5 mL laterally in the fashion of modified  Darel Hong,  right eye.  The right periocular region was then sterilely prepped and  draped in the usual sterile fashion.  Lid speculum was applied.  A 25-  gauge trocar was placed in the inferotemporal quadrant.  Superior trocar  was applied.  Quadrangular was then begun.  A dense vitreous and  preretinal hemorrhage were identified.  The scleral depression  inferiorly with vitreous hemorrhage clot in the vitreous base.  Preretinal vitreous hemorrhage was removed.  To hasten this process, an  immediate fluid-air exchange was completed.  This removed all of the  dense preretinal hemorrhage overlying the macular region.  Thereafter,  an air-fluid exchange was completed, and under fluid the remainder of  the procedure was completed.  No ongoing hemorrhage sites were noted.  Some dark clots were removed.  At this time, endolaser photocoagulation  placed at 360 degree.  Hemostasis was spontaneous.  The media stayed  clear.  At this time, the instruments were removed from the eye.  Superior trocar was removed.  The infusion was removed.  Subconjunctival  Decadron  applied.  There was a small rent in the nasal conjunctiva and this was  closed with  a solitary 7-0 Vicryl suture.  Sterile patch of Fox shield  was applied.  The patient was taken to short-stay area to be discharged  home as an outpatient.      Alford Highland Rankin, M.D.  Electronically Signed     GAR/MEDQ  D:  08/16/2007  T:  08/17/2007  Job:  [id number]

## 2010-07-14 NOTE — Op Note (Signed)
[name], [name]            ACCOUNT NO.:  [id number]   MEDICAL RECORD NO.:  [id number]          PATIENT TYPE:  INP   LOCATION:  6522                         FACILITY:  [facility]   PHYSICIAN:  Velora Heckler, MD      DATE OF BIRTH:  1920-02-01   DATE OF PROCEDURE:  01/19/2008  DATE OF DISCHARGE:                               OPERATIVE REPORT   PREOPERATIVE DIAGNOSES:  Lower gastrointestinal bleeding,  diverticulosis.   POSTOPERATIVE DIAGNOSES:  1. Lower gastrointestinal bleeding.  2. Diverticulosis.  3. Cholelithiasis.  4. Ventral incisional hernia.   PROCEDURES:  1. Subtotal colectomy with end ileostomy.  2. Cholecystectomy.  3. Primary repair of ventral incisional hernia.   SURGEON:  Velora Heckler, MD, FACS   ASSISTANT:  Letha Cape, PA   ANESTHESIA:  General.   ESTIMATED BLOOD LOSS:  Minimal.   PREPARATION:  Betadine.   COMPLICATIONS:  None.   INDICATIONS:  The patient is an 77 year old female with protracted  hospital course with lower GI bleeding.  She has had multiple studies  including colonoscopy, CT scan, nuclear medicine tagged red cell scan,  and mesenteric arteriography.  All these have failed to identify a  source of bleeding.  The patient has been transfused 14 units of packed  red blood cells.  The patient now comes to Surgery for subtotal  colectomy for lower GI bleeding, for diverticulosis, and colonic polyps.   DESCRIPTION OF PROCEDURE:  Procedure was done in OR #70 at [facility].  The patient was brought to the operating room,  placed in supine position on the operating room table.  Following the  administration of general anesthesia, the patient was prepped and draped  in usual strict aseptic fashion.  After ascertaining that, an adequate  level of anesthesia had been obtained, a midline abdominal incision was  made from #10 blade.  Dissection was carried through subcutaneous  tissues and hemostasis obtained with  electrocautery.  Fascia was incised  in the midline.  The peritoneal cavity was entered cautiously.  Adhesions were lysed bilaterally allowing for full access to the  abdomen.  A Balfour retractor was placed for exposure.  The abdomen was  explored.  There was a markedly redundant sigmoid colon with  diverticulosis.  There are scattered diverticuli around the remaining  colon.  There are palpable colonic polyps in the ascending colon.  There  is a gallbladder which was full of stones measuring 5-10 mm in size.   Subtotal colectomy was initiated by mobilizing the cecum.  There are  adhesions consistent with previous appendectomy.  Terminal ileum and  cecum are mobilized.  Peritoneum was incised along the white line of  T[place]t.  Dissection was carried around the hepatic flexure and the  peritoneum was incised allowing for full mobilization into the mid  transverse colon.  The mesocolon of the transverse colon was incised and  using the LigaSure the mesentery was divided.  Care was taken to avoid  the duodenum.  Dissection was carried down the right side of the  abdomen.  The ascending colon mesentery was divided  with the LigaSure.  Dissection was carried down to the level of the terminal ileum.  Mesenteric dissection was carried up to the bowel wall and the terminal  ileum was transected with a GIA stapler.  Colon was mobilized across the  midline to the middle colic vessels.  These were dissected out, doubly  clamped, and divided.  They are then doubly ligated with 2-0 silk  ligatures.   At this point, the gallbladder was resected.  This was done in a  retrograde fashion beginning at the peritoneal surface at the edge of  the liver.  Gallbladder was gently dissected out of the gallbladder bed  using the electrocautery for hemostasis.  Dissection was carried down to  the neck of the gallbladder.  Cystic artery was identified, doubly  clipped, and divided.  Gallbladder was then further  dissected out down  to the cystic duct.  A right-angle clamp was placed across the cystic  duct and a hemostat was placed across the neck of the gallbladder.  The  tissue was divided and the cystic duct was ligated with a 2-0 silk tie.  Gallbladder was passed off the field as specimen.   We continued our dissection of the colonic mesentery.  The splenic  flexure was mobilized taking care to divide the gastrocolic ligament  with the LigaSure and avoid the gastroepiploic artery.  The splenic  flexure was completely taken down and the splenic flexure of the colon  fully mobilized.  The mesentery was then divided with the LigaSure.  Dissection was carried down the descending colon.  The mesentery of a  very redundant sigmoid colon was divided with the LigaSure.  Dissection  was carried to the distal sigmoid colon.  Dissection was carried down  the mesentery over the pelvic brim and into the sacral hollow.  Mesenteric dissection was carried out down to the peritoneal reflection  with the LigaSure.  At this point, the mesentery of the proximal rectum  was dissected out and divided with the LigaSure.  Peritoneum was incised  bilaterally allowing full access to the bowel wall circumferentially.  Using a TA-60 stapler the superior portion of the rectum was closed with  the stapler.  Bowel was transected.  The entire intraabdominal colon was  then removed and submitted to Pathology for review.  Good hemostasis was  noted throughout the abdomen.  Abdomen was irrigated copiously with warm  saline which was evacuated.   Next, the site in the right lower quadrant of the abdominal wall was  selected and a round incision was made with a #10 blade.  Subcutaneous  tissue was excised.  A cruciate incision was made in the rectus fascia  and the terminal ileum was brought through the abdominal wall.  The  terminal ileum was secured to the peritoneum circumferentially with  interrupted 2-0 silk sutures.    There was a large ventral hernia in the epigastrium.  This was dissected  out.  Fascial edges of the rectus muscle are defined.  Subcutaneous  flaps were elevated bilaterally exposing the rectus muscles along the  extent of the midline incision.  A relaxing incision was necessary  bilaterally on the superior portion of the rectus muscle to allow  reapproximation of the musculature in the midline.  Midline abdominal  incision was then closed with interrupted #1 Novafil simple sutures with  buried knots.  Subcutaneous tissues were irrigated.  Skin was closed  with stainless steel staples.   Next, the end ileostomy was matured by  excising the staple line.  The  ostomy was matured in the fashion of Brooke with interrupted 3-0 Vicryl  sutures.  Good hemostasis was noted.  An ostomy appliance was applied.  Sterile dressings were applied.  The patient was awakened from  anesthesia and brought to the recovery room in stable condition.  The  patient tolerated the procedure well.      Velora Heckler, MD  Electronically Signed     TMG/MEDQ  D:  01/19/2008  T:  01/20/2008  Job:  [id number]

## 2010-07-14 NOTE — Discharge Summary (Signed)
[name], [name]            ACCOUNT NO.:  [id number]   MEDICAL RECORD NO.:  [id number]          PATIENT TYPE:  INP   LOCATION:  6741                         FACILITY:  [facility]   PHYSICIAN:  Lonia Blood, M.D.      DATE OF BIRTH:  07/13/1919   DATE OF ADMISSION:  01/09/2008  DATE OF DISCHARGE:  02/05/2008                               DISCHARGE SUMMARY   PRIMARY CARE PHYSICIAN:  Immunologist Care.   Please refer to previous discharge summary by Dr. Marthann Schiller on  November 17 and the most recent addendum by Dr. Peggye Pitt, on  January 30, 2008.  Between December 1 and December 7, the patient has  been recuperating following her colectomy and ileostomy.  She had a high  output colostomy in the first few days which has since resolved.  During  the period, she got some fluids.  She continued to have her  hemodialysis.  The main problem has been oral intake, which has been  low.  The patient has been eating mostly very small amount of meals, and  liquids for the most part.  She has gradually increased her intake.  She  was just recently initiated on Megace 400 mg daily.   DISCHARGE MEDICATIONS:  1. Megace 400 mg daily.  2. Aranesp 100 mcg IV on Mondays with dialysis.  3. Trusopt eye drops 1 drop in each eye twice a day.  4. Hectorol 0.5 mg IV Mondays, Wednesdays and Fridays.  5. Imodium 2 mg p.o. b.i.d.  6. Nephro supplement 1 can b.i.d.  7. Resource 1 can b.i.d.  8. Protonix 40 mg daily.  9. Renal vitamin 1 tablet at night.  10.Timolol eye ointment 1 drop each eye twice a day.   The patient will also continue with hemodialysis at the skilled nursing  facility.  Otherwise, the rest of the discharge summary and addendum is  actually per the HPI, as well as per previous discharge summaries as  indicated above.      Lonia Blood, M.D.  Electronically Signed     LG/MEDQ  D:  02/05/2008  T:  02/05/2008  Job:  [id number]

## 2010-07-14 NOTE — Discharge Summary (Signed)
NAMERILY, [name]            ACCOUNT NO.:  [id number]   MEDICAL RECORD NO.:  [id number]          PATIENT TYPE:  INP   LOCATION:  3038                         FACILITY:  [facility]   PHYSICIAN:  Theodosia Paling, MD    DATE OF BIRTH:  [birth year]   DATE OF ADMISSION:  04/19/2008  DATE OF DISCHARGE:  04/23/2008                               DISCHARGE SUMMARY   ADMITTING HISTORY:  Please refer to the admission note dictated by Dr.  Carlena Hurl under history of present illness.   DISCHARGE DIAGNOSES:  1. Altered mentation, most likely from uremia.  2. End-stage renal disease.  3. History of transient ischemic attack.  4. History of diverticular bleed.   DISCHARGE MEDICATIONS:  The patient is to continue home medications  which include the following:  1. Aggrenox 1 tablet p.o. q.12 h.  2. PhosLo 667 p.o. q.8 h.  3. Ceftin 250 mg p.o. q.12 h for 3 days.  4. Questran 4 g p.o. q.12 h.  5. Hectorol 4 mcg Monday, Wednesday, and Friday.  6. Guaifenesin 600 mg p.o. q.12 h for 3 days.  7. Xalatan eye drops topical q.h.s.  8. Megace 400 mg p.o. daily.  9. Protonix 40 mg daily.  10.Timolol topical eye drops q.12 h.   HOSPITAL COURSE:  The following issues were addressed during the  hospital hospitalization.  1. Altered mentation:  The patient it seems even at the time of      admission was oriented x3.  The patient's mentation stayed bright.      At the time of discharge she is oriented x3.  Most likely it was      secondary to uremia as the patient missed one hemodialysis      incidence.  The patient received hemodialysis during the      hospitalization and will continue as scheduled before.  2. End-stage renal disease:  Nephrologist kindly provided help and the      patient continued to receive hemodialysis.  She did not have any      acute complications from end-stage renal dialysis during her      admission here.  3. History of glaucoma:  Eye drops were continued.  4.  History of TIA:  Aggrenox was continued.   DISPOSITION:  The patient will be going back to a skilled nursing  facility for further physical therapy and strength training.  The  patient will continue her hemodialysis Monday, Wednesday, Friday.  She  will follow up with the primary care physician in 1 week's time also.   Total time spent in discharge of this patient around 45 minutes.       Theodosia Paling, MD  Electronically Signed     NP/MEDQ  D:  04/23/2008  T:  04/23/2008  Job:  [id number]

## 2010-07-14 NOTE — Op Note (Signed)
NAMEMARGAN, ELIAS            ACCOUNT NO.:  [id number]   MEDICAL RECORD NO.:  [id number]          PATIENT TYPE:  INP   LOCATION:  6741                         FACILITY:  [facility]   PHYSICIAN:  Sandria Bales. Ezzard Standing, M.D.  DATE OF BIRTH:  Dec 15, 1919   DATE OF PROCEDURE:  01/27/2008  DATE OF DISCHARGE:                               OPERATIVE REPORT   Date of surgery ?   PREOPERATIVE DIAGNOSIS:  Small-bowel obstruction with possible  parastomal hernia repair.   POSTOPERATIVE DIAGNOSIS:  Incarcerated small-bowel and parastomal  hernia.   PROCEDURE:  Laparoscopic exploration with reduction of small-bowel  hernia and closure of parastomal hernia.   SURGEON:  Sandria Bales. Ezzard Standing, MD   FIRST ASSISTANT:  Gabrielle Dare. Janee Morn, MD   ANESTHESIA:  General endotracheal.   ESTIMATED BLOOD LOSS:  Minimal.   INDICATIONS FOR PROCEDURE:  Ms. Kreitz is an 77 year old black female  who has end-stage renal disease and had been on dialysis about 4 years,  had a lower GI bleed in which she underwent a subtotal colectomy by Dr.  Darnell Level on January 19, 2008.   She did well earlier, but had increasing abdominal distention.  A CT  scan done yesterday shows a high-grade small bowel obstruction  suggesting either hernia or problem at her ileostomy site.   I talked with the patient and her family and discussed I thought she is  best explored.  I would actually try to solve that doing this  laparoscopically, but I also explained the possible of open surgery.  The risks of surgery include bleeding, infection, having to redo her  ostomy, resecting small bowel at her age of course there is a  significant risk for complication which would lead to her death.   OPERATIVE COURSE:  The patient was placed in a supine position and given  a general endotracheal anesthetic.  She had an NG tube already in place.  Her abdomen was prepped with Betadine solution.  I did not sew her  ostomy closed and I thought I  thought I had to have access to that  during the procedure and since I thought there was some stool that came  out of this and this would make skin contamination in the class IV  wounds since this was opened and cleared the contents out.   We accessed to her abdomen with a 10-mm Ethicon trocar in the left upper  quadrant and placed a 5 mm in left mid quadrant and 5 mg in the left  lower quadrant.  I snugged into the abdominal cavity.  I peeled off all  the small bowel, which was stuck to the anterior abdominal wall.  There  was no evidence that I could see actually the sutures when Dr. Gerrit Friends  closed the abdomen with interrupted Novofil, but I saw no evidence of  any injury to small bowel.   I then got up to the stomal area and actually saw a knuckle bowel which  was slid up in the parastomal hernia.  I was able to reduce this and  straightened the bowel out.  I thought at that  time I reduced her bowel  obstruction.   I then placed laparoscopically 4 sutures of 2-0 Vicryl suture and they  closed this parastomal hernia.  I thought I had reduced the hernia.  I  had at least repair of this in a fairly simple fashion with the sutures.  I was hesitant to try to introduce any mesh in and I thought it would  require a lot more surgery and possibly not provide any benefit.  I  thought it was taking care of the function of the obstruction.   At this point, Dr. Janee Morn placed pool tipped sucker down the  ileostomy.  He was able to suck out small bowel contents to confirm that  we had relieved this obstruction.   I then irrigated the abdomen with about a liter of saline, removed my  trocars.  I closed the skin of each trocar with a 5-0 Vicryl suture and  painted with a tincture of Benzoin.  The patient tolerated the procedure  well.  I will leave the NG tube at least an overnight.      Sandria Bales. Ezzard Standing, M.D.  Electronically Signed     DHN/MEDQ  D:  01/27/2008  T:  01/28/2008  Job:   [id number]   cc:   Wilber Bihari. Caryn Section, M.D.  [facility]

## 2010-07-14 NOTE — Discharge Summary (Signed)
[name], [name]            ACCOUNT NO.:  [id number]   MEDICAL RECORD NO.:  [id number]          PATIENT TYPE:  INP   LOCATION:  6740                         FACILITY:  [facility]   PHYSICIAN:  Alvin C. Lowell Guitar, M.D.  DATE OF BIRTH:  [birth year]   DATE OF ADMISSION:  04/14/2008  DATE OF DISCHARGE:  04/17/2008                               DISCHARGE SUMMARY   DISCHARGE DIAGNOSES:  1. Hyperkalemia, resolved.  2. Altered mental status with leukocytosis, resolved.  3. End-stage renal disease status post left upper arm arteriovenous      graft thrombectomy.  4. Anemia of chronic disease.  5. Secondary hyperparathyroidism.   PROCEDURES:  1. April 15, 2008 dialysis graft thrombectomy with PTA of the      venous anastomoses [place] by Dr. Jolaine Click.  2. Hemodialysis on February 15 and April 16, 2008.   HISTORY OF PRESENT ILLNESS:  Ms. Migliaccio is an 77 year old female who  currently resides in a nursing home receiving rehab.  The patient  presented with an acute onset of malaise and weakness and reports fever  sensation this morning associated with diaphoresis.  She was brought to  the emergency room where she was found to have a potassium of 7.8, a  white blood cell count of 26,000 and the 3 to 6 white blood cell per  high power field on urinalysis.  The patient denies dysuria.  In the  emergency room she was treated for elevated potassium with sodium  bicarbonate, Kayexalate, albuterol and intravenous calcium.  EKG did not  show any flattening of her QRS complex, but T waves were peaked.  She is  admitted for emergency dialysis and treatment of her infectious source.   ADMISSION LABS:  Potassium 7.8, hemoglobin 17.3, glucose 138, creatinine  9.6.   ADMISSION CHEST X-RAY:  COPD.  No acute findings.   HOSPITAL COURSE:  1. Hyperkalemia, resolved.  The patient was admitted and received an      emergent hemodialysis through a right groin hemodialysis catheter      that was placed  secondary to a clotted upper arm AV graft which      will be discussed in #3.  The patient was dialyzed on a zero K bath      for the entire treatment on 3 hours and 45 minutes.  The next day,      the patient's potassium was checked and it was found to be high at      6.8 with some elevated heart rate.  Therefore, the patient was      taken to hemodialysis again on February 16, dialyzed this time at 4      hours on a zero potassium bath for 30 minutes then a 1 potassium      bath for 1 hour, then changed to a 2 potassium bath.  This was      following her left upper arm declot which was able to be performed      on February 16.  The patient did improve and a potassium was      performed today on date of discharge  and was 4.3.  The etiology of      her hyperkalemia was known at time of discharge.  Will continue to      follow this as an outpatient.  Of note, during this admission      cardiac enzymes were cycled and did not show an elevation of      troponin and did not have EKG changes.  2. Altered mental status with leukocytosis.  The patient was started      on Rocephin empirically.  Septic workup was performed.  Blood      cultures x2 were pending at time of discharge but are no growth to      date.  Final urine cultures did yield no growth as well.  The      patient's Rocephin was discontinued at time of discharge.  She is      not requiring any further antibiotics.  Altered mental status has      improved to baseline at time of discharge.  3. End-stage renal disease with thrombosed left upper arm AV graft,      also refer to #1.  The patient dialyzed emergently February 15 and      February 16 secondary to hyperkalemia on admission.  She did have a      femoral line placed by Dr. Lowell Guitar for emergent dialysis on the      15th.  After the declot on the 16th she did dialyze again      successfully.  She is off her schedule at the time of discharge and      will resume hemodialysis on  Friday, which has been cleared with Dr.      Arrie Aran.  4. Anemia of chronic disease.  The patient's hemoglobin remained      stable this admission.  She does not require Epogen or iron      therapy.  5. Secondary hyperparathyroidism.  The patient continued on her      vitamin D and binders.   DISCHARGE MEDICATIONS:  1. Timoptic drop OU b.i.d.  2. Trusopt 2% ophthalmic solution 1 drop OU b.i.d.  3. Xalatan ophthalmic solution 0.005% OU q.h.s.  4. Megace 400 mg p.o. daily.  5. Protonix 40 mg q.h.s.  6. Aggrenox 1 capsule p.o. daily.  7. Questran 4 grams p.o. b.i.d.  8. Nephro-Vite p.o. daily.  9. PhosLo 667 mg p.o. t.i.d. a.c.   HEMODIALYSIS MEDICATIONS:  Hectorol 0.5 mcg IV q. hemodialysis  treatment.   DISCHARGE INSTRUCTIONS:  The patient will be discharged back to her  nursing facility.  She will resume hemodialysis on Friday, February 19.  No changes to her hemodialysis orders.  However, dialysis will need to  follow up on blood cultures x2 that were drawn on April 15, 2008 for  the final results.  We will need a STAT potassium on Friday, February 19  with call results to the physician extender.  The patient will remain on  a 2 potassium bath, which is a change for her.  New estimated dry weight  at this time and will be 42.5 kg.  The patient will need to remain on a  renal diet, 80 grams of protein, 2 grams of salt, 2 grams of potassium,  with a 1200 mL per day fluid restriction.  Time noted for this discharge  summary is 45 minutes.      Azucena Fallen, PA    ______________________________  Mindi Slicker Lowell Guitar, M.D.    MY/MEDQ  D:  04/17/2008  T:  04/17/2008  Job:  [id number]

## 2010-07-14 NOTE — Consult Note (Signed)
[name], [name]            ACCOUNT NO.:  [id number]   MEDICAL RECORD NO.:  [id number]          PATIENT TYPE:  INP   LOCATION:  6739                         FACILITY:  [facility]   PHYSICIAN:  James L. Deterding, M.D.DATE OF BIRTH:  11/16/1919   DATE OF CONSULTATION:  DATE OF DISCHARGE:                                 CONSULTATION   CONSULTING PHYSICIAN:  [facility].   REASON FOR CONSULT:  End-stage renal disease, weakness.   HISTORY OF PRESENT ILLNESS:  This is an 77 year old female who was  brought to the ER by family last night with generalized weakness and  dyspnea on exertion.  She had had nausea and vomiting x2 at home.  She  has had chronic back pain.  She has had no diarrhea, and she does have a  bag, says she has not had to empty her bag on her colostomy.  No graft  pain, dysuria, night sweats, cough, nausea, or vomiting.  White count  was 15.7.  She is afebrile.  She was worked up in the ER, no etiology  really found.   PAST MEDICAL HISTORY:  End-stage renal disease, iron deficiency anemia,  aluminum intoxication, hypertension, glaucoma, T11-12 kyphoplasty,  colectomy and partial ileostomy secondary to diverticular disease and  rupture, TIAs, GERD, COPD, chronic malnutrition.   MEDICINES AT HOME:  1. Nephro-Vite 1 a day.  2. Megace 800 mg a day.  3. Nexium 40 mg a day.  4. Aggrenox 1 b.i.d.  5. Xalatan 0.005 eye drops 1 drop to both eyes at bedtime.  6. Trusopt 2% 1 drop both eyes b.i.d.  7. Betimol 0.5% 1 drop both eyes b.i.d.   She dialyzes on Monday, Wednesday, and Friday at [facility],  access is left upper arm AV fistula.  She is on Infed 50 mg a week and  DFO 2 g IV once a week.   SOCIAL HISTORY:  She lives with her daughter at her daughter's home.  She was a housewife, is retired.  She has 4 children who are living.  Never a smoker.  Never a drinker.   FAMILY HISTORY:  Positive for hypertension, coronary artery disease,  colon cancer.   Mother died with hypertension, coronary artery disease.  She had one sibling with colon cancer.   REVIEW OF SYSTEMS:  GENERAL:  She has decreased appetite.  She is easily  fatigued and extremely weak.  HEENT:  No complaints.  SKIN:  Other than  very thin skin, no complaints.  CARDIOPULMONARY:  She has dyspnea on  exertion.  She sleeps on 2 pillows.  No PND or orthopnea.  Denies ankle  edema.  GI:  No specific complaints.  She has not to empty her bag.  There is no melena or blood in her stool.  She had the episodes of  nausea and vomiting.  She also has had some indigestion and heartburn.  NEUROPSYCHIATRIC:  Primarily weakness.  MUSCULOSKELETAL:  Back pain, a  little worse than usual but not a lot.   OBJECTIVE:  VITAL SIGNS:  Temp 97.3, pulse 94, respiratory rate is 20,  blood pressure 103/69, and  93% sat on room air.  GENERAL:  She is cachectic, chronically ill, pale.  She is alert and  oriented, not dyspneic.  HEENT:  Fundi unremarkable.  Sclerae unremarkable.  Pharynx  unremarkable.  NECK:  Without masses or thyromegaly.  She has some posterior cervical  lymph nodes but not excessively large.  CARDIOVASCULAR:  Regular rhythm.  Grade 1 to 2 over 6 systolic ejection  murmur, best heard at the left upper sternal border.  PMI is 9 cm out  from the midsternal line in the fifth intercostal space.  No significant  edema.  Pulse 2+/4+.  LUNGS:  Decreased breath sounds but no rales, rhonchi, or wheezes.  Slightly increased dullness to percussion, decreased expansion.  SKIN:  No rash or lesion but very friable and very thin skin.  ABDOMEN:  Soft, nontender.  She has an ostomy, right mid abdomen.  Prominent aorta.  GU AND RECTAL:  Deferred.  EXTREMITIES:  She has the access in her left upper arm with bruit.  MUSCULOSKELETAL:  She has some hypertrophic changes at PIPs, DIPs, and  MCPs.  NEUROLOGIC:  She is alert and oriented.  Strength is only 3+/5 but it is  symmetric.  Deep tendon  reflexes are 2+/4+.  Toes downgoing.   Chest x-ray, no active disease.  T-spine films, question of new endplate  fracture.  White count is 12,700, hemoglobin 14.7, platelets 221,000,  sodium 138, potassium 5, chloride 98, bicarbonate 24, creatinine 5.96,  BUN 29, glucose 74, calcium 10.7, albumin 3.9.   ASSESSMENT:  1. Generalized weakness.  This has been progressive.  She has      leukocytosis.  We need to rule out sepsis.  She is getting blood      culture and getting vancomycin and Fortaz.  Etiology of this is not      clear, maybe just because of chronic malnutrition.  2. Dyspnea on exertion.  Chest x-ray is unremarkable.  We will try to      get her volume down a little bit at dialysis.  3. End-stage renal disease.  Monday, Wednesday, Friday.  Try to get      her volume down.  4. Anemia.  Hemoglobin at goal.  No EPO.  5. Aluminum toxicity.  Continue with DFO.  6. Failure to thrive, this is a huge issue for her.  Really need to      try to ambulate her.  We will get PT and OT to see her.  7. Osteodystrophy.   PLAN:  As above.           ______________________________  Llana Aliment. Deterding, M.D.     JLD/MEDQ  D:  08/23/2008  T:  08/24/2008  Job:  [id number]   cc:   [facility]

## 2010-07-17 NOTE — Consult Note (Signed)
[name], [name]            ACCOUNT NO.:  [id number]   MEDICAL RECORD NO.:  [id number]          PATIENT TYPE:  INP   LOCATION:  4735                         FACILITY:  [facility]   PHYSICIAN:  Pramod P. Pearlean Brownie, MD    DATE OF BIRTH:  12-14-1919   DATE OF CONSULTATION:  01/18/2005  DATE OF DISCHARGE:                                   CONSULTATION   REFERRING PHYSICIAN:  Terrial Rhodes, M.D.   REASON FOR CONSULTATION:  Embolic stroke evaluated for patent foramen ovale.   [name] is an 77 year old African American lady who was admitted with  sudden onset of confusion after dialysis on January 13, 2005.  The  patient's symptoms were fluctuated and have gradually improved.  MRI scan of  the brain has shown small bilateral multiple posterior circulation embolic  infarcts.  Patient has not been found to have any obvious cardiac source of  embolism.  Transcranial Doppler bubble study is recommended to evaluate for  patent foramen ovale and paradoxical embolism.   PAST MEDICAL HISTORY:  1.  End-stage renal disease.  2.  Hypertension.  3.  Peritoneal disease.   SOCIAL HISTORY:  [place]  Does not smoke or drink.   FAMILY HISTORY:  Significant for stroke in aunt.   REVIEW OF SYSTEMS:  As stated above.   PHYSICAL EXAMINATION:  GENERAL APPEARANCE:  A frail, elderly African  American lady who is not in distress.  VITAL SIGNS:  She is afebrile.  Pulse 72 per minute, respirations 16 per  minute, distal pulses well felt.  NEUROLOGIC:  Patient is pleasant, awake and cooperative.  She follows  commands well.  There is no slurred speech, dysarthria or aphasia.  She is  oriented to time, place and person.  Eye movements are full range without  nystagmus or dysmetria.  Face is slightly symmetric but when she smiles,  there is no weakness noted.  Palatal movements are normal.  Tongue is  midline. Motor system exam reveals no __________or  extremity drift.  Finger-  to-nose and heel-to-toe coordination are accurate.  Gait was not tested.  Sensation intact.   LABORATORY DATA:  MRI scan of the brain reveals multiple tiny subcentimeter  areas of infarctions involving both cerebellum as well as brain stem.  They  do not appear to involve anterior circulation.  MRA of the brain reveals no  significant stenosis of either vertebral or basilar artery.  Carotid  ultrasound is unremarkable.   IMPRESSION:  An 77 year old lady with multiple embolic circulation infarcts  of unclear etiology.  Certainly cardiogenic embolism is possible.  Doubt  this is embolization through clotted AV fistula.  Embolization has been  described following intervention for clotted AV fistula.  However, patient's  symptoms clearly preceded placement of AV graft surgery so I think this will  be unlikely.   PLAN:  Transcranial Doppler bubble study would be appropriate to look for  evidence of patent foramen ovale and paradoxical embolism.  After taking  verbal informed consent from the patient and explaining procedure and  discussing risks, transcranial Doppler bubble study  was performed at the  bedside.  Both middle cerebral arteries are simultaneously insonated using a  headset.  Agitated saline injection at rest resorted in high intensity  transient signals for HITS.  Patient was advanced to do a Valsalva maneuver  which she was able to do with only modest effort.  No HITS were identified.  Agitated saline injection was repeated for bubble maneuver.  Again, no HITS  were noted.  Procedure was done __________.   IMPRESSION:  Negative transcranial Doppler bubble study, no evidence of high  intensity transient signals identified to indicate intracardiac or pulmonary  shunt.  I would recommend PCT monitoring for 30 minutes to look for any  spontaneous emboli.  Check fasting lipid profile, hemoglobin A1c and  homocystine level.  I would be happy to follow the  patient in consult. TEE  which is pending for later today.  Kindly call for questions.           ______________________________  Sunny Schlein. Pearlean Brownie, MD     PPS/MEDQ  D:  01/18/2005  T:  01/18/2005  Job:  [id number]

## 2010-07-17 NOTE — Discharge Summary (Signed)
[name], [name]            ACCOUNT NO.:  [id number]   MEDICAL RECORD NO.:  [id number]          PATIENT TYPE:  INP   LOCATION:  4735                         FACILITY:  [facility]   PHYSICIAN:  Terrial Rhodes, M.D.DATE OF BIRTH:  03-05-1919   DATE OF ADMISSION:  01/13/2005  DATE OF DISCHARGE:  01/19/2005                                 DISCHARGE SUMMARY   DISCHARGE DIAGNOSES:  1.  Altered mental status, now resolved.  2.  Multiple acute posterior circulation infarcts.  3.  End-stage renal disease.  4.  Status post placement of a new left upper arm AV graft.  5.  Placement of right IJ Diatek catheter.  6.  History of hypertension.  7.  Retinal disease.      1.  CRVO (status post abdominal pain hematoma on Coumadin in 1985).      2.  Cystoid macular edema.      3.  CNVM.   DISCHARGE MEDICATIONS:  1.  Caltrate 600 plus D once a day.  2.  Nephro-Vite once a day.  3.  Claritin 10 mg once a day.  4.  Nexium 40 mg once a day.  5.  Betimol one drop OU b.i.d.  6.  Trusopt one drop OU b.i.d.  7.  Xalatan one drop OD q.h.s.  8.  __________ one drop OU b.i.d.  9.  Aggrenox daily x2 week and then b.i.d.  10. Epogen 3600 units Monday, Wednesday and Friday with dialysis.  11. InFeD 50 mg q. week with hemodialysis.   CONDITION ON DISCHARGE:  Stable with resolution of mental status back to  baseline.  She is to continue dialysis as an outpatient on her regular  schedule at [facility].  She will followup with Dr.  Pearlean Brownie with [facility] in one month.  The patient is to  schedule this appointment.  Phone number has been provided on her discharge  sheet.   CONSULTATIONS:  Neurology, Dr. Nash Shearer, Dr. Pearlean Brownie, Cardiology, Dr. Dietrich Pates  for TEE.  Dr. Madilyn Fireman of [facility].   PROCEDURE:  1.  CT head January 13, 2005, no acute intracranial findings.  Mild      cerebral atrophy.  MRI/MRA brain numerous sub centimeter foci of acute      ischemic changes  affecting both cerebellar hemispheres, both posterior      and medial temporal lobes as well as the left thalamus.  These lay      within the distribution of the vertebrobasilar arteries and their      branches.  Concern is raised for shower of emboli versus hypofusion      water shed type event.  MRA no significant intracranial atherosclerotic      change identified.  Carotid Doppler's January 15, 2005 no significant      plague visualized.  No ICA stenosis noted.  Vertebral artery flow      antegrade.  On January 14, 2005 placement of a new left upper arm AV      graft and placement of a right IJ Diatek catheter by Dr. Madilyn Fireman of [facility].  2.  Transcranial Doppler bubble  study January 18, 2005.  Negative for      intracardiac or pulmonary shunt.  3.  Transesophageal echocardiogram January 19, 2005.  No right-to-left      shunt with contrast.   HISTORY OF PRESENT ILLNESS:  Ms. Manner is a 77 year old woman with  hypertension and end-stage renal disease on hemodialysis since 1976 who  became confused and disoriented after hemodialysis on the day of admission.  She was also hypertensive at that time which is unusual for her.  She had  emesis x1 when she stood up.  She was diaphoretic but was otherwise  asymptomatic.  She was oriented to person only.  At that time CBG was 153.  There was some questionable focal neurologic findings at hemodialysis.  This  was not reproduced at the emergency department.   PHYSICAL EXAMINATION:  VITAL SIGNS:  Temperature 96.7, pulse 84, blood  pressure 192/89 which came down to systolic 160's in the ER without  treatment, respiratory rate 18, oxygen saturation 96% on room air.  GENERAL:  This is a frail elderly woman somewhat sleepy but arousable in no  acute distress.  HEENT:  Mucous membranes moist.  Oropharynx is clear.  Pupils equal, round,  and reactive to light.  NECK:  No thyromegaly and no lymphadenopathy.  CARDIOVASCULAR:  Regular rate and  rhythm.  LUNGS:  Clear bilaterally.  ABDOMEN:  Scaphoid, nontender, nondistended, positive bowel sounds.  EXTREMITIES:  No edema, no rash.  Left upper arm AV fistula.  Positive  thrill but no bruit.  NEUROLOGIC:  Oriented to person and place.  Thinks the year is 30.  Possible left facial droop.  Cranial nerves otherwise intact.  Strength is  4/5 in the left upper extremity and 3-4/5 in the right upper extremity and  5/5 in the lower extremity bilaterally.  Downgoing Babinski bilaterally.  Sensation intact to light touch bilaterally.  Some dysmetria by finger-to-  nose on the right side but questionable contribution of the __________.   LABORATORY DATA:  White count 21.4, hemoglobin 12.1, platelet count 300.  Sodium 134, potassium 6.6, bicarbonate 22, BUN 60, creatinine 9.3, glucose  134, total protein 7.3, calcium 9.1, albumin 3.8, AST 19, ALT 10, alkaline  phosphatase 53, bilirubin 0.8.   HOSPITAL COURSE:  Problem 1. Altered mental status.  The original concern  was for CVA.  Her initial CT of the head was negative, however, an MRI/MRA  the following day did show multiple acute posterior circulation infarct that  were concerning for emboli.  Carotid Doppler's were obtained that were  negative.  Neurology consult was requested since the acute infarct occurred  in the setting of a clotted AV fistula.  It was for a possible emboli from  the fistula.  This would require a intracardiac shunt and therefore a  transcranial Doppler bubble study was obtained on January 18, 2005 along  with a transesophageal echocardiogram on January 19, 2005, however, neither  of these demonstrated evidence of intracardiac or pulmonary shunt.  The  source of her emboli/infarction is still unclear this time.  It is possible  it is related to some sort of cardiac arrhythmia, however, this was not  noted during her admission.  It was recommended by Dr. Pearlean Brownie that the patient began Aggrenox therapy for  prevention of future embolic events.  She  will start Aggrenox one tablet once a day for two weeks and then twice a  day.  She will followup with Dr. Pearlean Brownie in one month.   Problem  2. Hypertension.  The patient's blood pressure was unusually  elevated on admission.  Her systolic blood pressure was in the 190's on  admission to the ER but came down without treatment and remained well-  controlled throughout her hospital stay, in fact at her last dialysis  treatment January 17, 2005 blood pressure dropped in to the 70s and we were  unable to pull any fluid off.  It is unclear why she was hypertensive.  If  the hypertension does continue it would be reasonable to restart her Norvasc  or an ACE inhibitor.   Problem 3. Leukocytosis.  Concern originally was for infection as possibly  the inciting cause for altered mental status.  Blood cultures taken on  admission remain negative.  Chest x-ray did not show an infiltrate and white  count did decrease over the course of her admission.  She remained afebrile.   Problem 4. Hyperkalemia.  On admission the potassium was 6.6.  This  responded well to two doses of PR Kayexalate.  It was thought that the  hyperkalemia was due to recirculation in the setting of an occluded AV  fistula.  Her potassium remained normal following dialysis via the Diatek  catheter.  There were no peaked T-waves on EKG when the patient was  hyperkalemic.   Problem 5. End-stage renal disease.  Ms. Hoots dialyzes Monday,  Wednesday and Friday at [facility].  She was last  dialyzed on January 17, 2005 at which time her blood pressure dropped and  were unable to pull any fluid off.  She will resume dialysis on her normal  schedule tomorrow Wednesday, January 20, 2005 at [facility].  There are currently no changes in her hemodialysis orders.   Problem 6. Vascular access.  No bruit was heard in the left upper arm AV  fistula on  admission.  A __________ on January 07, 2005 showed an occlusion  of this fistula and the patient's hyperkalemia only hours after dialysis was  to the conclusion of recirculation through a clotted AV fistula.  [facility] was  asked to evaluate the patient.  Dr. Madilyn Fireman placed a new left upper arm AV  graft with a Diatek catheter for temporary access on January 14, 2005.   Problem 7. Retinal disease.  The patient was scheduled to see Dr. Luciana Axe on  January 14, 2005.  His office was informed that she was admitted and would  need to reschedule this appointment.  Her eye drops were continued during  her hospital admission.   DISCHARGE LABORATORY AND VITAL SIGNS:  On January 17, 2005 sodium 135,  potassium 3.2, chloride 98, CO2 27, BUN 41, creatinine 8.8, glucose 89,  albumin 3.0, calcium 8.4, phosphorus 5.0.  White count 12.0, hemoglobin 9.4,  platelets 286, hemoglobin A1C 4.7.  Vitals January 19, 2005 temperature 98.4, pulse 87, respirations 16, blood pressure 128/68, 98% on room air.      Clent Demark, M.D.    ______________________________  Terrial Rhodes, M.D.    LG/MEDQ  D:  01/19/2005  T:  01/19/2005  Job:  [id number]   cc:   [phone] [facility] fax to

## 2010-07-17 NOTE — Consult Note (Signed)
NAMEAVYN, ADEN            ACCOUNT NO.:  [id number]   MEDICAL RECORD NO.:  [id number]          PATIENT TYPE:  INP   LOCATION:  4735                         FACILITY:  [facility]   PHYSICIAN:  Bevelyn Buckles. Champey, M.D.DATE OF BIRTH:  August 14, 1919   DATE OF CONSULTATION:  01/14/2005  DATE OF DISCHARGE:                                   CONSULTATION   REFERRING PHYSICIAN:  Tinnie Gens P. Caporossi, M.D.   REASON FOR CONSULTATION:  TIA versus stroke for altered mental status.   HISTORY OF PRESENT ILLNESS:  Ms. Villagomez is an 77 year old African  American female with multiple medical problems who presented to day after  hemodialysis with episodes of confusion, disorientation and elevated blood  pressure.  Patient states that she does not remember finishing dialysis and  does not remember much of the day.  She became very confused, diaphoretic  and disoriented after the end of her dialysis.  Patient is currently and  gradually resolved and back to her baseline.  Family has noticed patient  being more sleepy over the last few days as well.  Patient denies any other  symptoms of focal weakness, numbness, vision changes, speech changes,  swallowing problems, chewing problems, dizziness, vertigo or loss of  consciousness.   PAST MEDICAL HISTORY:  1.  Hypertension.  2.  End-stage renal disease on hemodialysis.  3.  Retinal disease.  4.  Leukocytosis.   CURRENT MEDICATIONS:  1.  Baby aspirin.  2.  Caltrate.  3.  Claritin.  4.  Nephro-Vite.  5.  Nexium.  6.  Eye drops.   ALLERGIES:  NO KNOWN DRUG ALLERGIES.   FAMILY HISTORY:  Positive for CVA.   SOCIAL HISTORY:  Patient currently lives with her daughter.  Does not smoke  or use any alcohol.   REVIEW OF SYSTEMS:  Positive as per history of present illness.  Review of  systems negative as per HPI greater than six other organ systems.   PHYSICAL EXAMINATION:  VITAL SIGNS:  Temperature 96.7, pulse 84, blood  pressure 192/89,  respirations 18.  HEENT:  Normocephalic and atraumatic.  Extraocular movements intact.  Pupils  are equal, round and reactive to light.  NECK:  Supple.  No carotid bruits are heard.  LUNGS:  Clear.  CARDIOVASCULAR:  Regular.  ABDOMEN:  Soft and nontender.  EXTREMITIES:  Good pulses.  NEUROLOGIC:  Patient is alert and oriented x2 to name and place.  She is not  oriented to year.  She does know the president, the month and her family  members that are present.  Language is blurted, slowed at times yet  understandable.  Cranial nerves II-XII grossly intact.  Motor examination  shows good strength in all four extremities and normal tone.  No drift is  noted.  Sensory examination is within normal limits to light touch and  pinprick.  Reflexes 1+ throughout.  Toes are neutral bilaterally.  Cerebellar function:  Patient has bilateral pinpoint dysmetria which is  minimal.  Gait is not assessed secondary to safety.   LABORATORY DATA:  WBC 21.4, hemoglobin 12.1, hematocrit 36.0, platelets 300.  Sodium 134, potassium 6.6,  chloride 100, CO2 22, BUN 62, creatinine 9.3,  glucose 126, calcium 9.1.  LFTs within normal limits.   He had a CT head in the emergency room that showed no acute findings with  some mild cerebral atrophy.   IMPRESSION:  Ms. Lovick is an 77 year old African American female with  episode of confusion, disorientation, status post hemodialysis with the risk  factors and elevated blood pressure, there is concern for possible transient  ischemic attack or small stroke.  I agree with admitting the patient and  taking MRI and MRA of the brain and carotid Dopplers.  We will continue  aspirin for now.  If there is any evidence of stroke or compromise in her  blood vessels, we will consider changing her aspirin to Plavix or Aggrenox.  There could be several other possible etiologies for her confusion.  One is  elevated wbc and infection. Another possible etiology is metabolic   encephalopathy or hepatic encephalopathy. Agree with getting pan cultures  and working up for an infectious process and need to __________ and  optimally treat metabolic derangements as much as possible. Also order an  EEG in the a.m. to evaluate for any seizures or encephalopathic process.  We  will follow the patient while she is in the hospital.           ______________________________  Bevelyn Buckles. Nash Shearer, M.D.     DRC/MEDQ  D:  01/14/2005  T:  01/14/2005  Job:  [id number]

## 2010-07-17 NOTE — Op Note (Signed)
[name], [name]            ACCOUNT NO.:  [id number]   MEDICAL RECORD NO.:  [id number]          PATIENT TYPE:  OIB   LOCATION:  2852                         FACILITY:  [facility]   PHYSICIAN:  Di Kindle. Edilia Bo, M.D.DATE OF BIRTH:  1920/01/23   DATE OF PROCEDURE:  08/13/2004  DATE OF DISCHARGE:                                 OPERATIVE REPORT   PREOPERATIVE DIAGNOSIS:  Chronic renal failure.   POSTOPERATIVE DIAGNOSIS:  Chronic renal failure.   PROCEDURE:  Placement of left upper arm AV fistula.   SURGEON:  Waverly Ferrari, M.D.   ASSISTANT:  Pecola Leisure, P.A.   ANESTHESIA:  Local with sedation.   TECHNIQUE:  The patient was taken to the operating room and sedated by  anesthesia. The left upper extremity was prepped and draped in the usual  sterile fashion. After the skin was infiltrated with 1% lidocaine, a  transverse incision was made at the antecubital level and here the brachial  artery was dissected free and had a good pulse. The cephalic vein was  dissected free. It was slightly small.  However it irrigated up nicely with  heparinized saline. After it was ligated and divided, it easily took a 4 mm  dilator. I passed a 3 Fogarty the entire length without evidence of  obstruction. The patient was then heparinized. The brachial artery was  clipped proximally. Distally, a longitudinal arteriotomy was made. The vein  was mobilized over and sewn end-to-side to the artery using continuous 6-0  Prolene suture. At the completion, there was an excellent thrill in the  fistula and a palpable radial pulse. Hemostasis was obtained and the wound  was closed with a deep layer of 3-0 Vicryl. The skin was closed 4-0 Vicryl.  A sterile dressing was applied. The patient tolerated the procedure well was  transferred to the recovery room in satisfactory condition. All needle and  sponge counts were correct.       CSD/MEDQ  D:  08/13/2004  T:  08/13/2004  Job:  [id number]

## 2010-07-17 NOTE — H&P (Signed)
[name], [name]            ACCOUNT NO.:  [id number]   MEDICAL RECORD NO.:  [id number]          PATIENT TYPE:  INP   LOCATION:  4735                         FACILITY:  [facility]   PHYSICIAN:  Terrial Rhodes, M.D.DATE OF BIRTH:  [birth year]   DATE OF ADMISSION:  01/13/2005  DATE OF DISCHARGE:                                HISTORY & PHYSICAL   PROBLEM LIST:  1.  Altered mental status after hemodialysis at 5 p.m. on day of admission,      associated hypertension, emesis x1.  2.  End-stage renal disease, on hemodialysis Monday, Wednesday, Friday at      [facility] since Jul 17, 2004.  3.  Hypertension, now off antihypertensives.  4.  Retinal disease.      1.  Central retinal vein occlusion status post abdominal hematoma on          Coumadin in 1985.      2.  Cystoid macular edema.      3.  Choroidal neovascular membrane.  5.  Leukocytosis.  6.  Hyperkalemia.   HOME MEDICATIONS:  1.  Aspirin 81 mg p.o. daily.  2.  Caltrate 600 + D daily.  3.  Claritin 10 mg daily.  4.  Nephro-Vite daily.  5.  Nexium 40 mg daily.  6.  Betimol OU b.i.d.  7.  Trusopt OU b.i.d.  8.  Xibrom OU b.i.d.  9.  Xalatan OD daily.   HISTORY OF PRESENT ILLNESS:  [name] is an 77 year old woman with  hypertension and end-stage renal disease on hemodialysis since May 2006 who  became confused and disoriented after hemodialysis today. She was also  hypertensive which is unusual - she is normally normotensive, and had emesis  x1 when she stood up. She was diaphoretic but was otherwise asymptomatic.  She was oriented to person only which is not her baseline. A CBG at the time  was 153. There was some possible left upper extremity weakness documented at  hemodialysis. She was sent to the emergency department for evaluation. Her  family reports increased sleepiness over the past several days, but  otherwise no symptoms at home. Specifically, she denies any fevers or  chills, nausea  or vomiting, cough, abdominal pain, dysuria, flank pain,  diarrhea, or changes in mental status prior to this episode.   PAST MEDICAL HISTORY:  See problem list.   SOCIAL HISTORY:  Lives with daughter in [place]. She has good family  support. No tobacco and no alcohol.   FAMILY HISTORY:  CVA in aunts. Denies coronary artery disease, denies kidney  disease, although I have seen in previous records that she does have family  members who have been on dialysis for some time.   REVIEW OF SYSTEMS:  Positive only for cold-like symptoms about 3 weeks ago  that are now resolved, and worsening vision in the right eye over the last 2  weeks. She does not describe this as amaurosis fugax, just a decrease in her  visual acuity.   PHYSICAL EXAMINATION:  VITAL SIGNS:  Temperature is 96.7, pulse 84, blood  pressure 192/89 which came down to  systolic of 160s while in the ER without  medication, respiratory rate 18, oxygen saturation 96% on room air.  GENERAL:  This is frail elderly woman, somewhat sleepy but arousable, in no  acute distress.  HEENT:  Mucous membranes are moist, oropharynx is clear. No thyromegaly, no  lymphadenopathy. Pupils equal, round, and reactive to light OU.  CARDIOVASCULAR:  Regular rate and rhythm.  LUNGS:  Clear bilaterally.  ABDOMEN:  Scaphoid, nontender, nondistended, positive bowel sounds.  EXTREMITIES:  No edema, no rash. Left upper arm A-V fistula positive thrill,  no bruit heard.  NEUROLOGIC:  Oriented to person and place, thinks the year is 1973, question  right facial droop, cranial nerves otherwise intact. Strength 4/5 left upper  extremity, 3-4/5 right upper extremity, 5/5 lower extremity bilaterally.  Downgoing Babinski bilaterally. Sensation intact to light touch bilaterally.  Some dysmetria by finger-to-nose on the right side but question the  contribution of poor vision to this.   CT head negative for acute findings.   LABORATORY DATA:  White count  21.4, hemoglobin 12.1, platelets 300. Sodium  134, potassium 6.6, chloride 100, bicarb 22, BUN 60, creatinine 9.3, glucose  134, total protein 7.3, calcium 9.1, albumin 3.8, AST 19, ALT 10, alk phos  53, bilirubin 0.8.   ASSESSMENT AND PLAN:  This is an 77 year old woman with altered mental  status and hypertension following hemodialysis today.   1.  Altered mental status. The concern is for CVA. She has no acute findings      on CT. Will consider an MR in the morning but not sure how this would      change our management. Her symptoms are suspicious for a TIA. Will admit      and watch closely as her risk for strokes is increased in the 24-48      hours following TIA. Plan for carotid Dopplers in the morning to assess      for cerebrovascular disease. Will ask neurology to see. Other causes of      altered mental status include hypertensive encephalopathy, infection, or      acute MI.  2.  Hypertension. [name] blood pressure is unusually elevated. She      is documented to be normotensive at baseline. Her systolic blood      pressure was in the 190s on admission but has come down without      treatment in the emergency department. We will withhold her blood      pressure medications to avoid cerebral hyperperfusion at this time. She      had been on Norvasc along with an ACE inhibitor several months ago.  3.  Leukocytosis, question infection as the cause of altered mental status.      Will check blood cultures x2. Unclear source of infection. Will get      chest x-ray, urine culture, repeat CBC with diff in the morning. No      evidence of infection from history or exam.  4.  Hyperkalemia, question hemolysis. It would be unusual to be hyperkalemic      so soon after hemodialysis. Will repeat the BMET and treat with      Kayexalate if this is a true value. She may need hemodialysis in the     morning if hyperkalemia persists. Her EKG is without peaked T waves or      other  changes associated with hyperkalemia.  5.  Retinal disease. She is scheduled to see Dr. Luciana Axe in his  office      tomorrow. Will call his office and inform him of the admission.  6.  End-stage renal disease. She dialyzes Monday, Wednesday, Friday at [facility]. Will obtain her flow sheets in the morning      from today's hemodialysis. If she truly is hyperkalemic, we may need to      repeat hemodialysis tomorrow.  7.  Absence of bruit in arteriovenous fistula. She does have an abnormal      shuntogram on January 07, 2005. Unclear if a thrombectomy was performed      in the interim. May need CVTS to see during this admission for access      issues.      Clent Demark, M.D.    ______________________________  Terrial Rhodes, M.D.    LG/MEDQ  D:  01/14/2005  T:  01/14/2005  Job:  [id number]

## 2010-07-17 NOTE — Procedures (Signed)
EEG NUMBER:  [id number]   REQUESTING PHYSICIAN:  Bevelyn Buckles. Nash Shearer, M.D.   CLINICAL HISTORY:  This is an 77 year old woman with history of confusion.  EEG is performed for evaluation of possible encephalopathy.   DESCRIPTION:  The dominant rhythm's tracing is a low to moderate amplitude  alpha rhythm of 9-10 Hertz which predominates posteriorly.  Appears without  abnormal asymmetry and attenuates with eye opening and closing.  Low  amplitude fast activity seen frontally and centrally and appears without  abnormal asymmetry.  No focal slowing is noted.  No epileptiform discharges  are seen.  The patient remained in the wake state throughout the recording.  Flow stimulation produced weakness symmetric driving responses, but was  discontinued early because it bothered the patient.  Hyperventilation was  not performed.  Single channel devoted to EKG revealed sinus rhythm  throughout at a rate of approximately 96 beats per minute.   CONCLUSION:  Normal study in the awake state.      Michael L. Thad Ranger, M.D.  Electronically Signed     ZOX:WRUE  D:  01/14/2005 20:22:11  T:  01/15/2005 11:22:21  Job #:  [id number]   cc:   Bevelyn Buckles. Nash Shearer, M.D.  Fax: [phone]

## 2010-07-17 NOTE — Consult Note (Signed)
[name], [name]            ACCOUNT NO.:  [id number]   MEDICAL RECORD NO.:  [id number]          PATIENT TYPE:  EMS   LOCATION:  MAJO                         FACILITY:  [facility]   PHYSICIAN:  Karol T. Lazarus Salines, M.D. DATE OF BIRTH:  Jun 11, 1919   DATE OF CONSULTATION:  01/16/2006  DATE OF DISCHARGE:  01/16/2006                                 CONSULTATION   CHIEF COMPLAINT:  Facial trauma.   HISTORY:  An 77 year old black female stumbled and fell approximately 4  hours ago in her daughter's living room.  She struck her left cheek  against the floor.  She had a black eye and swelling almost immediately.  No nasal bleeding.  No loss of consciousness.  She was brought to the  emergency room.  Evaluation showed no change in mental status.  The  cervical spine was cleared.  CT scan of the head and face shows a  minimally displaced blowout fracture of the floor of the left orbit with  some extraconal intraorbital hemorrhage and slight proptosis.  The  patient does take Aggrenox for TIAs but no other known blood thinners.  She has had both cataracts replaced and additional care for glaucoma  under the care of Dr. Luciana Axe.  She has been using an ice pack in the  emergency room.  ENT was called in consultation for evaluation of the  fracture.   EXAMINATION:  GENERAL:  This is a petite older black female in mild  distress.  She has prominent upper and lower lid ecchymotic swelling.  Mental status seems appropriate.  She has a slight excoriation of the  mid brow which does not require sutures.  She seems to have intact  vision independently and decent range of motion in both eyes without  subjective diplopia or objective disconjugate gaze.  No visible hyphema  or anisocoria.  The remainder of the bony facial contours is intact.  I  did not examine ears, nose or mouth.   X-RAYS:  I reviewed her CT scans of the facial bones including axial,  coronal, and sagittal reconstructions with the  findings as described  above.   IMPRESSION:  Minimally displaced left orbital blowout fracture.  Slight  intraorbital hemorrhage, probably secondary to blood thinners.  At this  point no significant proptosis, no ophthalmoplegia, or diminished  vision.   PLAN:  I recommend ice, elevation, Tylenol #3 for pain relief,  amoxicillin to prevent infection with the open fracture into the  sinuses.  I would avoid nose blowing for 2 weeks to prevent intraorbital  emphysema.  I think she needs to see Dr. Luciana Axe or other eye doctors  tomorrow to make sure there has been no further injury to the globe.  I  would have her daughter, who will stay with her this evening, check  range of motion and vision every 2 hours through the night and contact  us if there is any change.  She is on chronic hemodialysis three times  weekly which she will continue.      [name]. Lazarus Salines, M.D.  Electronically Signed    KTW/MEDQ  D:  01/16/2006  T:  01/16/2006  Job:  [id number]   cc:   Alford Highland. Rankin, M.D.

## 2010-07-17 NOTE — Op Note (Signed)
NAMECORENE, RESNICK            ACCOUNT NO.:  [id number]   MEDICAL RECORD NO.:  [id number]          PATIENT TYPE:  INP   LOCATION:  4735                         FACILITY:  [facility]   PHYSICIAN:  Balinda Quails, M.D.    DATE OF BIRTH:  [birth year]   DATE OF PROCEDURE:  01/14/2005  DATE OF DISCHARGE:                                 OPERATIVE REPORT   SURGEON:  Denman George, M.D.   ASSISTANT:  Rowe Clack, P.A.-C.   ANESTHETIC:  Local MAC.   ANESTHESIOLOGIST:  Dr. Sampson Goon.   PREOPERATIVE DIAGNOSES:  1.  End-stage renal failure.  2.  Clotted left brachiocephalic arteriovenous fistula.   POSTOPERATIVE DIAGNOSES:  1.  End-stage renal failure.  2.  Clotted left brachiocephalic arteriovenous fistula.   PROCEDURE:  1.  Left upper arm arteriovenous graft.  2.  Ultrasound-guided right internal jugular Diatek catheter.   OPERATIVE PROCEDURE:  The patient brought to the operating room in stable  condition. Placed in the supine position. The left arm prepped and draped in  sterile fashion.   Skin and subcutaneous tissue and left antecubital fossa instilled with 1%  Xylocaine with epinephrine. Transverse skin incision made through the left  antecubital fossa. Dissection carried down through the subcutaneous tissue.  The left brachiocephalic arteriovenous anastomosis was identified. The vein  was mobilized down to the artery. The brachial artery freed proximally and  distally and encircled with Vesi-loops.   Skin and subcutaneous tissue in the left axilla instilled with 1% Xylocaine  with epinephrine. Longitudinal skin incision made in left axilla. Dissection  carried down through subcutaneous tissue. The basilic vein was identified.  This was approximately 5 mm in size. The vein mobilized adequately and  encircled with a Vesi-loop.   A curvilinear tunnel was made between two incisions. A 4 x 7 mm Gore-Tex  graft placed through the tunnel. The 4 mm end of the graft was  approximated  to the artery. The artery controlled proximally and distally with bulldog  clamps. The arteriovenous anastomosis taken down. The arteriotomy lengthened  with Potts scissors. The end of the Gore-Tex graft was beveled and  anastomosed end-to-side to the brachial artery using running 6-0 Prolene  suture. The artery then flushed through the graft. The graft controlled  proximally with a fistula clamp.   In the axilla, the basilic vein was ligated with 2-0 silk distally.  Controlled proximally with a Gregory clamp. Divided transversely. The 7 mm  end of the graft was divided and anastomosed end-to-end to the basilic vein  using running 6-0 Prolene suture. At completion of this, the graft was  flushed. Clamps removed. Excellent flow present. Adequate hemostasis  obtained. Sponges and instrument counts correct.   Subcutaneous tissue closed running 3-0 Vicryl suture. Skin closed with 4-0  Monocryl. Steri-Strips applied.   Attention then placed on the right neck. This was prepped and draped in  sterile fashion. The patient placed in Trendelenburg position. Ultrasound of  the right neck revealed a small but patent right internal jugular vein. Skin  and subcutaneous tissue instilled with 1% Xylocaine. A needle was easily  introduced  in the right internal jugular vein. A 0.035 J-wire passed through  the needle into the superior vena cava under fluoroscopy. The needle  removed, 12 dilator advanced over guidewire.  A 16 dilator and tearaway  sheath advanced over the guidewire, the dilator and the guidewire removed.  Diatek catheter placed through the sheath, positioned at the superior vena  cava right atrial junction. The tearaway sheath removed.  A subcutaneous  tunnel created. The catheter brought through the tunnel. The catheter  divided, hub mechanism assembled. Catheter flushed with heparin and saline  solution and capped with heparin. The insertion site closed with interrupted   4-0 nylon suture. Catheter fixed to skin with interrupted 2-0 silk suture.  Sterile dressings applied.   The patient transferred to recovery in stable condition. Chest x-ray  ordered.      Balinda Quails, M.D.  Electronically Signed     PGH/MEDQ  D:  01/14/2005  T:  01/15/2005  Job:  [id number]

## 2010-08-19 ENCOUNTER — Ambulatory Visit (HOSPITAL_COMMUNITY)
Admission: RE | Admit: 2010-08-19 | Discharge: 2010-08-19 | Disposition: A | Discharge status: Home / Self Care | Payer: Medicare Other | Source: Ambulatory Visit | Attending: Nephrology | Admitting: Nephrology

## 2010-08-19 ENCOUNTER — Other Ambulatory Visit (HOSPITAL_COMMUNITY): Payer: Self-pay | Admitting: Nephrology

## 2010-08-19 DIAGNOSIS — T82868A Thrombosis of vascular prosthetic devices, implants and grafts, initial encounter: Secondary | ICD-10-CM

## 2010-08-19 DIAGNOSIS — T82898A Other specified complication of vascular prosthetic devices, implants and grafts, initial encounter: Secondary | ICD-10-CM | POA: Insufficient documentation

## 2010-08-19 DIAGNOSIS — J4489 Other specified chronic obstructive pulmonary disease: Secondary | ICD-10-CM | POA: Insufficient documentation

## 2010-08-19 DIAGNOSIS — Z8673 Personal history of transient ischemic attack (TIA), and cerebral infarction without residual deficits: Secondary | ICD-10-CM | POA: Insufficient documentation

## 2010-08-19 DIAGNOSIS — Y832 Surgical operation with anastomosis, bypass or graft as the cause of abnormal reaction of the patient, or of later complication, without mention of misadventure at the time of the procedure: Secondary | ICD-10-CM | POA: Insufficient documentation

## 2010-08-19 DIAGNOSIS — J449 Chronic obstructive pulmonary disease, unspecified: Secondary | ICD-10-CM | POA: Insufficient documentation

## 2010-08-19 DIAGNOSIS — N186 End stage renal disease: Secondary | ICD-10-CM | POA: Insufficient documentation

## 2010-08-19 DIAGNOSIS — N2581 Secondary hyperparathyroidism of renal origin: Secondary | ICD-10-CM | POA: Insufficient documentation

## 2010-08-19 DIAGNOSIS — K219 Gastro-esophageal reflux disease without esophagitis: Secondary | ICD-10-CM | POA: Insufficient documentation

## 2010-08-19 DIAGNOSIS — I120 Hypertensive chronic kidney disease with stage 5 chronic kidney disease or end stage renal disease: Secondary | ICD-10-CM | POA: Insufficient documentation

## 2010-08-19 DIAGNOSIS — D649 Anemia, unspecified: Secondary | ICD-10-CM | POA: Insufficient documentation

## 2010-08-19 MED ORDER — IOHEXOL 300 MG/ML  SOLN: 100.0000 mL | Freq: Once | INTRAMUSCULAR | Status: AC | PRN

## 2010-08-28 ENCOUNTER — Other Ambulatory Visit (HOSPITAL_COMMUNITY): Payer: Self-pay | Admitting: Nephrology

## 2010-08-28 DIAGNOSIS — T8249XA Other complication of vascular dialysis catheter, initial encounter: Secondary | ICD-10-CM

## 2010-08-29 ENCOUNTER — Ambulatory Visit (HOSPITAL_COMMUNITY)
Admission: RE | Admit: 2010-08-29 | Discharge: 2010-08-29 | Disposition: A | Discharge status: Home / Self Care | Payer: Medicare Other | Source: Ambulatory Visit | Attending: Nephrology | Admitting: Nephrology

## 2010-08-29 ENCOUNTER — Other Ambulatory Visit (HOSPITAL_COMMUNITY): Payer: Self-pay | Admitting: Nephrology

## 2010-08-29 DIAGNOSIS — H409 Unspecified glaucoma: Secondary | ICD-10-CM | POA: Insufficient documentation

## 2010-08-29 DIAGNOSIS — T82898A Other specified complication of vascular prosthetic devices, implants and grafts, initial encounter: Secondary | ICD-10-CM | POA: Insufficient documentation

## 2010-08-29 DIAGNOSIS — D649 Anemia, unspecified: Secondary | ICD-10-CM | POA: Insufficient documentation

## 2010-08-29 DIAGNOSIS — I120 Hypertensive chronic kidney disease with stage 5 chronic kidney disease or end stage renal disease: Secondary | ICD-10-CM | POA: Insufficient documentation

## 2010-08-29 DIAGNOSIS — K5730 Diverticulosis of large intestine without perforation or abscess without bleeding: Secondary | ICD-10-CM | POA: Insufficient documentation

## 2010-08-29 DIAGNOSIS — K219 Gastro-esophageal reflux disease without esophagitis: Secondary | ICD-10-CM | POA: Insufficient documentation

## 2010-08-29 DIAGNOSIS — Z992 Dependence on renal dialysis: Secondary | ICD-10-CM | POA: Insufficient documentation

## 2010-08-29 DIAGNOSIS — T8249XA Other complication of vascular dialysis catheter, initial encounter: Secondary | ICD-10-CM

## 2010-08-29 DIAGNOSIS — Y832 Surgical operation with anastomosis, bypass or graft as the cause of abnormal reaction of the patient, or of later complication, without mention of misadventure at the time of the procedure: Secondary | ICD-10-CM | POA: Insufficient documentation

## 2010-08-29 DIAGNOSIS — N186 End stage renal disease: Secondary | ICD-10-CM | POA: Insufficient documentation

## 2010-08-29 DIAGNOSIS — J449 Chronic obstructive pulmonary disease, unspecified: Secondary | ICD-10-CM | POA: Insufficient documentation

## 2010-08-29 DIAGNOSIS — N2581 Secondary hyperparathyroidism of renal origin: Secondary | ICD-10-CM | POA: Insufficient documentation

## 2010-08-29 DIAGNOSIS — J4489 Other specified chronic obstructive pulmonary disease: Secondary | ICD-10-CM | POA: Insufficient documentation

## 2010-08-29 DIAGNOSIS — Z01812 Encounter for preprocedural laboratory examination: Secondary | ICD-10-CM | POA: Insufficient documentation

## 2010-08-29 DIAGNOSIS — Z8673 Personal history of transient ischemic attack (TIA), and cerebral infarction without residual deficits: Secondary | ICD-10-CM | POA: Insufficient documentation

## 2010-08-29 MED ORDER — IOHEXOL 300 MG/ML  SOLN
100.0000 mL | Freq: Once | INTRAMUSCULAR | Status: AC | PRN
  Administered 2010-08-29: 40 mL via INTRAVENOUS

## 2010-08-31 LAB — POCT I-STAT 4, (NA,K, GLUC, HGB,HCT)
Hemoglobin: 14.3 g/dL (ref 12.0–15.0)
Sodium: 139 mEq/L (ref 135–145)

## 2010-09-16 ENCOUNTER — Encounter: Payer: Self-pay | Admitting: Family Medicine

## 2010-09-16 DIAGNOSIS — I10 Essential (primary) hypertension: Secondary | ICD-10-CM | POA: Insufficient documentation

## 2010-09-16 DIAGNOSIS — H35359 Cystoid macular degeneration, unspecified eye: Secondary | ICD-10-CM | POA: Insufficient documentation

## 2010-09-16 DIAGNOSIS — Z992 Dependence on renal dialysis: Secondary | ICD-10-CM | POA: Insufficient documentation

## 2010-09-16 DIAGNOSIS — N289 Disorder of kidney and ureter, unspecified: Secondary | ICD-10-CM | POA: Insufficient documentation

## 2010-11-26 LAB — BASIC METABOLIC PANEL
CO2: 32
Chloride: 93 — ABNORMAL LOW
GFR calc Af Amer: 16 — ABNORMAL LOW
Sodium: 136

## 2010-11-26 LAB — CBC
HCT: 39.7
Hemoglobin: 13.2
MCHC: 33.3
RBC: 4.09
RDW: 14.1

## 2010-11-26 LAB — APTT: aPTT: 30

## 2010-11-26 LAB — PROTIME-INR: INR: 0.9

## 2010-12-01 LAB — CROSSMATCH
ABO/RH(D): O NEG
ABO/RH(D): O NEG
Antibody Screen: NEGATIVE

## 2010-12-01 LAB — HEMOGLOBIN AND HEMATOCRIT, BLOOD
HCT: 18.9 % — ABNORMAL LOW (ref 36.0–46.0)
HCT: 24.3 % — ABNORMAL LOW (ref 36.0–46.0)
HCT: 24.8 % — ABNORMAL LOW (ref 36.0–46.0)
HCT: 25.2 % — ABNORMAL LOW (ref 36.0–46.0)
HCT: 27.6 % — ABNORMAL LOW (ref 36.0–46.0)
HCT: 30.2 % — ABNORMAL LOW (ref 36.0–46.0)
HCT: 22.5 % — ABNORMAL LOW (ref 36.0–46.0)
HCT: 25.8 % — ABNORMAL LOW (ref 36.0–46.0)
HCT: 27.1 % — ABNORMAL LOW (ref 36.0–46.0)
HCT: 27.4 % — ABNORMAL LOW (ref 36.0–46.0)
HCT: 33.6 % — ABNORMAL LOW (ref 36.0–46.0)
HCT: 41.3 % (ref 36.0–46.0)
HCT: 49 % — ABNORMAL HIGH (ref 36.0–46.0)
Hemoglobin: 10.1 g/dL — ABNORMAL LOW (ref 12.0–15.0)
Hemoglobin: 6.5 g/dL — CL (ref 12.0–15.0)
Hemoglobin: 8.2 g/dL — ABNORMAL LOW (ref 12.0–15.0)
Hemoglobin: 8.4 g/dL — ABNORMAL LOW (ref 12.0–15.0)
Hemoglobin: 8.5 g/dL — ABNORMAL LOW (ref 12.0–15.0)
Hemoglobin: 9.3 g/dL — ABNORMAL LOW (ref 12.0–15.0)
Hemoglobin: 13.1 g/dL (ref 12.0–15.0)
Hemoglobin: 13.9 g/dL (ref 12.0–15.0)
Hemoglobin: 16 g/dL — ABNORMAL HIGH (ref 12.0–15.0)
Hemoglobin: 7.7 g/dL — CL (ref 12.0–15.0)
Hemoglobin: 8.6 g/dL — ABNORMAL LOW (ref 12.0–15.0)
Hemoglobin: 9.3 g/dL — ABNORMAL LOW (ref 12.0–15.0)
Hemoglobin: 9.9 g/dL — ABNORMAL LOW (ref 12.0–15.0)

## 2010-12-01 LAB — URINE MICROSCOPIC-ADD ON

## 2010-12-01 LAB — RENAL FUNCTION PANEL
Albumin: 1.3 g/dL — ABNORMAL LOW (ref 3.5–5.2)
Albumin: 1.5 g/dL — ABNORMAL LOW (ref 3.5–5.2)
Albumin: 1.6 g/dL — ABNORMAL LOW (ref 3.5–5.2)
Albumin: 1.8 g/dL — ABNORMAL LOW (ref 3.5–5.2)
Albumin: 1.8 g/dL — ABNORMAL LOW (ref 3.5–5.2)
BUN: 18 mg/dL (ref 6–23)
BUN: 18 mg/dL (ref 6–23)
BUN: 25 mg/dL — ABNORMAL HIGH (ref 6–23)
BUN: 41 mg/dL — ABNORMAL HIGH (ref 6–23)
CO2: 20 mEq/L (ref 19–32)
CO2: 20 mEq/L (ref 19–32)
CO2: 21 mEq/L (ref 19–32)
CO2: 23 mEq/L (ref 19–32)
CO2: 26 mEq/L (ref 19–32)
CO2: 28 mEq/L (ref 19–32)
CO2: 32 mEq/L (ref 19–32)
Calcium: 7.4 mg/dL — ABNORMAL LOW (ref 8.4–10.5)
Calcium: 7.6 mg/dL — ABNORMAL LOW (ref 8.4–10.5)
Calcium: 7.8 mg/dL — ABNORMAL LOW (ref 8.4–10.5)
Calcium: 8 mg/dL — ABNORMAL LOW (ref 8.4–10.5)
Chloride: 101 mEq/L (ref 96–112)
Chloride: 101 mEq/L (ref 96–112)
Chloride: 102 mEq/L (ref 96–112)
Chloride: 102 mEq/L (ref 96–112)
Chloride: 105 mEq/L (ref 96–112)
Chloride: 92 mEq/L — ABNORMAL LOW (ref 96–112)
Chloride: 97 mEq/L (ref 96–112)
Chloride: 99 mEq/L (ref 96–112)
Creatinine, Ser: 3.25 mg/dL — ABNORMAL HIGH (ref 0.4–1.2)
Creatinine, Ser: 5.66 mg/dL — ABNORMAL HIGH (ref 0.4–1.2)
Creatinine, Ser: 6.75 mg/dL — ABNORMAL HIGH (ref 0.4–1.2)
GFR calc Af Amer: 11 mL/min — ABNORMAL LOW (ref >60)
GFR calc Af Amer: 12 mL/min — ABNORMAL LOW (ref >60)
GFR calc Af Amer: 16 mL/min — ABNORMAL LOW (ref >60)
GFR calc Af Amer: 7 mL/min — ABNORMAL LOW (ref >60)
GFR calc Af Amer: 7 mL/min — ABNORMAL LOW (ref >60)
GFR calc Af Amer: 7 mL/min — ABNORMAL LOW (ref >60)
GFR calc Af Amer: 9 mL/min — ABNORMAL LOW (ref >60)
GFR calc non Af Amer: 10 mL/min — ABNORMAL LOW (ref >60)
GFR calc non Af Amer: 13 mL/min — ABNORMAL LOW (ref >60)
GFR calc non Af Amer: 5 mL/min — ABNORMAL LOW (ref >60)
GFR calc non Af Amer: 6 mL/min — ABNORMAL LOW (ref >60)
GFR calc non Af Amer: 6 mL/min — ABNORMAL LOW (ref >60)
GFR calc non Af Amer: 7 mL/min — ABNORMAL LOW (ref >60)
GFR calc non Af Amer: 9 mL/min — ABNORMAL LOW (ref >60)
Glucose, Bld: 42 mg/dL — ABNORMAL LOW (ref 70–99)
Glucose, Bld: 66 mg/dL — ABNORMAL LOW (ref 70–99)
Glucose, Bld: 68 mg/dL — ABNORMAL LOW (ref 70–99)
Glucose, Bld: 79 mg/dL (ref 70–99)
Glucose, Bld: 79 mg/dL (ref 70–99)
Glucose, Bld: 80 mg/dL (ref 70–99)
Phosphorus: 3.4 mg/dL (ref 2.3–4.6)
Phosphorus: 6.5 mg/dL — ABNORMAL HIGH (ref 2.3–4.6)
Potassium: 3.5 mEq/L (ref 3.5–5.1)
Potassium: 3.7 mEq/L (ref 3.5–5.1)
Potassium: 3.9 mEq/L (ref 3.5–5.1)
Potassium: 4.2 mEq/L (ref 3.5–5.1)
Potassium: 4.4 mEq/L (ref 3.5–5.1)
Potassium: 4.6 mEq/L (ref 3.5–5.1)
Sodium: 127 mEq/L — ABNORMAL LOW (ref 135–145)
Sodium: 129 mEq/L — ABNORMAL LOW (ref 135–145)
Sodium: 133 mEq/L — ABNORMAL LOW (ref 135–145)
Sodium: 135 mEq/L (ref 135–145)
Sodium: 137 mEq/L (ref 135–145)
Sodium: 137 mEq/L (ref 135–145)
Sodium: 137 mEq/L (ref 135–145)
Sodium: 138 mEq/L (ref 135–145)

## 2010-12-01 LAB — COMPREHENSIVE METABOLIC PANEL
ALT: 17 U/L (ref 0–35)
ALT: 17 U/L (ref 0–35)
ALT: 21 U/L (ref 0–35)
AST: 21 U/L (ref 0–37)
AST: 19 U/L (ref 0–37)
Albumin: 2.9 g/dL — ABNORMAL LOW (ref 3.5–5.2)
Albumin: 1.5 g/dL — ABNORMAL LOW (ref 3.5–5.2)
Albumin: 1.8 g/dL — ABNORMAL LOW (ref 3.5–5.2)
Alkaline Phosphatase: 42 U/L (ref 39–117)
Alkaline Phosphatase: 34 U/L — ABNORMAL LOW (ref 39–117)
Alkaline Phosphatase: 48 U/L (ref 39–117)
Alkaline Phosphatase: 67 U/L (ref 39–117)
BUN: 13 mg/dL (ref 6–23)
BUN: 16 mg/dL (ref 6–23)
BUN: 17 mg/dL (ref 6–23)
CO2: 26 mEq/L (ref 19–32)
CO2: 27 mEq/L (ref 19–32)
Calcium: 7.6 mg/dL — ABNORMAL LOW (ref 8.4–10.5)
Calcium: 7.7 mg/dL — ABNORMAL LOW (ref 8.4–10.5)
Chloride: 100 mEq/L (ref 96–112)
Chloride: 101 mEq/L (ref 96–112)
Chloride: 104 mEq/L (ref 96–112)
Creatinine, Ser: 4.6 mg/dL — ABNORMAL HIGH (ref 0.4–1.2)
Creatinine, Ser: 5.73 mg/dL — ABNORMAL HIGH (ref 0.4–1.2)
GFR calc Af Amer: 8 mL/min — ABNORMAL LOW (ref >60)
GFR calc Af Amer: 11 mL/min — ABNORMAL LOW (ref >60)
GFR calc non Af Amer: 7 mL/min — ABNORMAL LOW (ref >60)
GFR calc non Af Amer: 9 mL/min — ABNORMAL LOW (ref >60)
GFR calc non Af Amer: 9 mL/min — ABNORMAL LOW (ref >60)
Glucose, Bld: 147 mg/dL — ABNORMAL HIGH (ref 70–99)
Glucose, Bld: 94 mg/dL (ref 70–99)
Glucose, Bld: 97 mg/dL (ref 70–99)
Potassium: 3.4 mEq/L — ABNORMAL LOW (ref 3.5–5.1)
Potassium: 3.5 mEq/L (ref 3.5–5.1)
Potassium: 4 mEq/L (ref 3.5–5.1)
Potassium: 4.3 mEq/L (ref 3.5–5.1)
Sodium: 138 mEq/L (ref 135–145)
Sodium: 138 mEq/L (ref 135–145)
Sodium: 140 mEq/L (ref 135–145)
Total Bilirubin: 0.3 mg/dL (ref 0.3–1.2)
Total Bilirubin: 0.5 mg/dL (ref 0.3–1.2)
Total Bilirubin: 1.1 mg/dL (ref 0.3–1.2)
Total Protein: 4 g/dL — ABNORMAL LOW (ref 6.0–8.3)
Total Protein: 4.1 g/dL — ABNORMAL LOW (ref 6.0–8.3)
Total Protein: 4.3 g/dL — ABNORMAL LOW (ref 6.0–8.3)

## 2010-12-01 LAB — POCT I-STAT 4, (NA,K, GLUC, HGB,HCT)
HCT: 30 % — ABNORMAL LOW (ref 36.0–46.0)
Hemoglobin: 10.2 g/dL — ABNORMAL LOW (ref 12.0–15.0)
Sodium: 133 mEq/L — ABNORMAL LOW (ref 135–145)

## 2010-12-01 LAB — BASIC METABOLIC PANEL
BUN: 3 mg/dL — ABNORMAL LOW (ref 6–23)
BUN: 9 mg/dL (ref 6–23)
BUN: 6 mg/dL (ref 6–23)
CO2: 25 mEq/L (ref 19–32)
CO2: 32 mEq/L (ref 19–32)
CO2: 21 mEq/L (ref 19–32)
CO2: 28 mEq/L (ref 19–32)
Calcium: 7.3 mg/dL — ABNORMAL LOW (ref 8.4–10.5)
Calcium: 7.7 mg/dL — ABNORMAL LOW (ref 8.4–10.5)
Calcium: 7.9 mg/dL — ABNORMAL LOW (ref 8.4–10.5)
Calcium: 8.2 mg/dL — ABNORMAL LOW (ref 8.4–10.5)
Chloride: 103 mEq/L (ref 96–112)
Chloride: 98 mEq/L (ref 96–112)
Chloride: 98 mEq/L (ref 96–112)
Creatinine, Ser: 3.31 mg/dL — ABNORMAL HIGH (ref 0.4–1.2)
Creatinine, Ser: 6.65 mg/dL — ABNORMAL HIGH (ref 0.4–1.2)
Creatinine, Ser: 7.15 mg/dL — ABNORMAL HIGH (ref 0.4–1.2)
GFR calc Af Amer: 26 mL/min — ABNORMAL LOW (ref >60)
GFR calc Af Amer: 7 mL/min — ABNORMAL LOW (ref >60)
GFR calc Af Amer: 7 mL/min — ABNORMAL LOW (ref >60)
GFR calc Af Amer: 9 mL/min — ABNORMAL LOW (ref >60)
GFR calc non Af Amer: 15 mL/min — ABNORMAL LOW (ref >60)
GFR calc non Af Amer: 5 mL/min — ABNORMAL LOW (ref >60)
GFR calc non Af Amer: 7 mL/min — ABNORMAL LOW (ref >60)
Glucose, Bld: 85 mg/dL (ref 70–99)
Glucose, Bld: 90 mg/dL (ref 70–99)
Glucose, Bld: 151 mg/dL — ABNORMAL HIGH (ref 70–99)
Glucose, Bld: 71 mg/dL (ref 70–99)
Potassium: 3.2 mEq/L — ABNORMAL LOW (ref 3.5–5.1)
Potassium: 4 mEq/L (ref 3.5–5.1)
Potassium: 3.4 mEq/L — ABNORMAL LOW (ref 3.5–5.1)
Potassium: 4.3 mEq/L (ref 3.5–5.1)
Sodium: 136 mEq/L (ref 135–145)
Sodium: 134 mEq/L — ABNORMAL LOW (ref 135–145)
Sodium: 139 mEq/L (ref 135–145)

## 2010-12-01 LAB — CULTURE, BLOOD (ROUTINE X 2): Culture: NO GROWTH

## 2010-12-01 LAB — URINALYSIS, ROUTINE W REFLEX MICROSCOPIC
Glucose, UA: NEGATIVE mg/dL
Hgb urine dipstick: NEGATIVE
Nitrite: NEGATIVE
Protein, ur: 100 mg/dL — AB
Specific Gravity, Urine: 1.014 (ref 1.005–1.030)
Urobilinogen, UA: 0.2 mg/dL (ref 0.0–1.0)
Urobilinogen, UA: 1 mg/dL (ref 0.0–1.0)
pH: 7 (ref 5.0–8.0)

## 2010-12-01 LAB — CLOSTRIDIUM DIFFICILE EIA: C difficile Toxins A+B, EIA: NEGATIVE

## 2010-12-01 LAB — URINE CULTURE
Colony Count: >=100000
Culture: NO GROWTH
Special Requests: NEGATIVE

## 2010-12-01 LAB — CBC
HCT: 34 % — ABNORMAL LOW (ref 36.0–46.0)
HCT: 38.1 % (ref 36.0–46.0)
HCT: 30.7 % — ABNORMAL LOW (ref 36.0–46.0)
HCT: 36.5 % (ref 36.0–46.0)
HCT: 36.5 % (ref 36.0–46.0)
HCT: 37.8 % (ref 36.0–46.0)
HCT: 38.7 % (ref 36.0–46.0)
HCT: 40.6 % (ref 36.0–46.0)
HCT: 41.1 % (ref 36.0–46.0)
Hemoglobin: 10.3 g/dL — ABNORMAL LOW (ref 12.0–15.0)
Hemoglobin: 12.6 g/dL (ref 12.0–15.0)
Hemoglobin: 10.3 g/dL — ABNORMAL LOW (ref 12.0–15.0)
Hemoglobin: 12.1 g/dL (ref 12.0–15.0)
Hemoglobin: 13.1 g/dL (ref 12.0–15.0)
Hemoglobin: 13.1 g/dL (ref 12.0–15.0)
Hemoglobin: 14.1 g/dL (ref 12.0–15.0)
Hemoglobin: 14.3 g/dL (ref 12.0–15.0)
Hemoglobin: 15.4 g/dL — ABNORMAL HIGH (ref 12.0–15.0)
Hemoglobin: 8.9 g/dL — ABNORMAL LOW (ref 12.0–15.0)
Hemoglobin: 9.3 g/dL — ABNORMAL LOW (ref 12.0–15.0)
Hemoglobin: 9.4 g/dL — ABNORMAL LOW (ref 12.0–15.0)
MCHC: 33.2 g/dL (ref 30.0–36.0)
MCHC: 34.1 g/dL (ref 30.0–36.0)
MCHC: 31.9 g/dL (ref 30.0–36.0)
MCHC: 32.3 g/dL (ref 30.0–36.0)
MCHC: 32.8 g/dL (ref 30.0–36.0)
MCHC: 33.4 g/dL (ref 30.0–36.0)
MCHC: 33.5 g/dL (ref 30.0–36.0)
MCHC: 33.6 g/dL (ref 30.0–36.0)
MCHC: 33.8 g/dL (ref 30.0–36.0)
MCV: 98 fL (ref 78.0–100.0)
MCV: 90.2 fL (ref 78.0–100.0)
MCV: 90.3 fL (ref 78.0–100.0)
MCV: 90.4 fL (ref 78.0–100.0)
MCV: 90.6 fL (ref 78.0–100.0)
MCV: 91.5 fL (ref 78.0–100.0)
MCV: 92.2 fL (ref 78.0–100.0)
MCV: 92.9 fL (ref 78.0–100.0)
MCV: 93.1 fL (ref 78.0–100.0)
MCV: 93.1 fL (ref 78.0–100.0)
Platelets: 341 10*3/uL (ref 150–400)
Platelets: 233 10*3/uL (ref 150–400)
Platelets: 242 10*3/uL (ref 150–400)
Platelets: 269 10*3/uL (ref 150–400)
Platelets: 272 10*3/uL (ref 150–400)
Platelets: 299 10*3/uL (ref 150–400)
Platelets: 356 10*3/uL (ref 150–400)
RBC: 3.36 MIL/uL — ABNORMAL LOW (ref 3.87–5.11)
RBC: 3.47 MIL/uL — ABNORMAL LOW (ref 3.87–5.11)
RBC: 2.83 MIL/uL — ABNORMAL LOW (ref 3.87–5.11)
RBC: 3.05 MIL/uL — ABNORMAL LOW (ref 3.87–5.11)
RBC: 3.09 MIL/uL — ABNORMAL LOW (ref 3.87–5.11)
RBC: 3.11 MIL/uL — ABNORMAL LOW (ref 3.87–5.11)
RBC: 3.56 MIL/uL — ABNORMAL LOW (ref 3.87–5.11)
RBC: 4.05 MIL/uL (ref 3.87–5.11)
RBC: 4.18 MIL/uL (ref 3.87–5.11)
RBC: 4.2 MIL/uL (ref 3.87–5.11)
RBC: 4.4 MIL/uL (ref 3.87–5.11)
RBC: 4.42 MIL/uL (ref 3.87–5.11)
RBC: 4.75 MIL/uL (ref 3.87–5.11)
RBC: 4.76 MIL/uL (ref 3.87–5.11)
RBC: 5.11 MIL/uL (ref 3.87–5.11)
RDW: 15.2 % (ref 11.5–15.5)
RDW: 15.4 % (ref 11.5–15.5)
RDW: 16 % — ABNORMAL HIGH (ref 11.5–15.5)
RDW: 14.9 % (ref 11.5–15.5)
RDW: 18.1 % — ABNORMAL HIGH (ref 11.5–15.5)
RDW: 18.4 % — ABNORMAL HIGH (ref 11.5–15.5)
RDW: 18.5 % — ABNORMAL HIGH (ref 11.5–15.5)
RDW: 19.8 % — ABNORMAL HIGH (ref 11.5–15.5)
RDW: 19.9 % — ABNORMAL HIGH (ref 11.5–15.5)
WBC: 8.1 10*3/uL (ref 4.0–10.5)
WBC: 11.6 10*3/uL — ABNORMAL HIGH (ref 4.0–10.5)
WBC: 14.4 10*3/uL — ABNORMAL HIGH (ref 4.0–10.5)
WBC: 16.7 10*3/uL — ABNORMAL HIGH (ref 4.0–10.5)
WBC: 16.8 10*3/uL — ABNORMAL HIGH (ref 4.0–10.5)
WBC: 17.4 10*3/uL — ABNORMAL HIGH (ref 4.0–10.5)
WBC: 20.7 10*3/uL — ABNORMAL HIGH (ref 4.0–10.5)
WBC: 21.1 10*3/uL — ABNORMAL HIGH (ref 4.0–10.5)
WBC: 32.7 10*3/uL — ABNORMAL HIGH (ref 4.0–10.5)
WBC: 33.1 10*3/uL — ABNORMAL HIGH (ref 4.0–10.5)

## 2010-12-01 LAB — DIFFERENTIAL
Basophils Absolute: 0 10*3/uL (ref 0.0–0.1)
Basophils Absolute: 0 10*3/uL (ref 0.0–0.1)
Basophils Relative: 0 % (ref 0–1)
Eosinophils Absolute: 0.2 10*3/uL (ref 0.0–0.7)
Eosinophils Relative: 3 % (ref 0–5)
Lymphocytes Relative: 9 % — ABNORMAL LOW (ref 12–46)
Monocytes Absolute: 0.8 10*3/uL (ref 0.1–1.0)
Monocytes Absolute: 1 10*3/uL (ref 0.1–1.0)
Neutro Abs: 14 10*3/uL — ABNORMAL HIGH (ref 1.7–7.7)

## 2010-12-01 LAB — GLUCOSE, CAPILLARY: Glucose-Capillary: 64 mg/dL — ABNORMAL LOW (ref 70–99)

## 2010-12-01 LAB — MAGNESIUM: Magnesium: 1.7 mg/dL (ref 1.5–2.5)

## 2010-12-01 LAB — CULTURE, BLOOD (SINGLE): Culture: NO GROWTH

## 2010-12-01 LAB — PHOSPHORUS
Phosphorus: 1.9 mg/dL — ABNORMAL LOW (ref 2.3–4.6)
Phosphorus: 4.7 mg/dL — ABNORMAL HIGH (ref 2.3–4.6)

## 2010-12-01 LAB — IRON AND TIBC: TIBC: 112 ug/dL — ABNORMAL LOW (ref 250–470)

## 2010-12-01 LAB — FERRITIN: Ferritin: 706 ng/mL — ABNORMAL HIGH (ref 10–291)

## 2010-12-01 LAB — PROTIME-INR
Prothrombin Time: 12.8 seconds (ref 11.6–15.2)
Prothrombin Time: 14.6 seconds (ref 11.6–15.2)

## 2010-12-01 LAB — PREPARE RBC (CROSSMATCH)

## 2010-12-01 LAB — OCCULT BLOOD X 1 CARD TO LAB, STOOL: Fecal Occult Bld: POSITIVE

## 2010-12-01 LAB — GLUCOSE, RANDOM: Glucose, Bld: 162 mg/dL — ABNORMAL HIGH (ref 70–99)

## 2010-12-04 LAB — RENAL FUNCTION PANEL
Albumin: 1.3 g/dL — ABNORMAL LOW (ref 3.5–5.2)
Albumin: 1.4 g/dL — ABNORMAL LOW (ref 3.5–5.2)
Albumin: 1.5 g/dL — ABNORMAL LOW (ref 3.5–5.2)
BUN: 8 mg/dL (ref 6–23)
CO2: 25 mEq/L (ref 19–32)
Calcium: 7.7 mg/dL — ABNORMAL LOW (ref 8.4–10.5)
Calcium: 7.9 mg/dL — ABNORMAL LOW (ref 8.4–10.5)
Chloride: 100 mEq/L (ref 96–112)
Creatinine, Ser: 4.54 mg/dL — ABNORMAL HIGH (ref 0.4–1.2)
GFR calc Af Amer: 11 mL/min — ABNORMAL LOW (ref >60)
GFR calc Af Amer: 9 mL/min — ABNORMAL LOW (ref >60)
GFR calc non Af Amer: 7 mL/min — ABNORMAL LOW (ref >60)
GFR calc non Af Amer: 9 mL/min — ABNORMAL LOW (ref >60)
Phosphorus: 1.8 mg/dL — ABNORMAL LOW (ref 2.3–4.6)
Phosphorus: 2.1 mg/dL — ABNORMAL LOW (ref 2.3–4.6)
Potassium: 2.9 mEq/L — ABNORMAL LOW (ref 3.5–5.1)
Potassium: 3.6 mEq/L (ref 3.5–5.1)
Sodium: 128 mEq/L — ABNORMAL LOW (ref 135–145)
Sodium: 134 mEq/L — ABNORMAL LOW (ref 135–145)

## 2010-12-04 LAB — BASIC METABOLIC PANEL
BUN: 7 mg/dL (ref 6–23)
CO2: 28 mEq/L (ref 19–32)
Calcium: 7.3 mg/dL — ABNORMAL LOW (ref 8.4–10.5)
Chloride: 99 mEq/L (ref 96–112)
Creatinine, Ser: 3.18 mg/dL — ABNORMAL HIGH (ref 0.4–1.2)
Creatinine, Ser: 4.78 mg/dL — ABNORMAL HIGH (ref 0.4–1.2)
GFR calc Af Amer: 10 mL/min — ABNORMAL LOW (ref >60)

## 2010-12-04 LAB — CBC
MCHC: 32 g/dL (ref 30.0–36.0)
MCHC: 32.4 g/dL (ref 30.0–36.0)
MCV: 92.6 fL (ref 78.0–100.0)
Platelets: 293 10*3/uL (ref 150–400)
Platelets: 358 10*3/uL (ref 150–400)
RBC: 4.2 MIL/uL (ref 3.87–5.11)
RBC: 4.31 MIL/uL (ref 3.87–5.11)
WBC: 14.1 10*3/uL — ABNORMAL HIGH (ref 4.0–10.5)

## 2010-12-04 LAB — HEMOGLOBIN: Hemoglobin: 12.3 g/dL (ref 12.0–15.0)

## 2010-12-07 ENCOUNTER — Other Ambulatory Visit (HOSPITAL_COMMUNITY): Payer: Self-pay | Admitting: Nephrology

## 2010-12-07 DIAGNOSIS — N186 End stage renal disease: Secondary | ICD-10-CM

## 2010-12-08 ENCOUNTER — Ambulatory Visit (HOSPITAL_COMMUNITY)
Admission: RE | Admit: 2010-12-08 | Discharge: 2010-12-08 | Disposition: A | Discharge status: Home / Self Care | Payer: Medicare Other | Source: Ambulatory Visit | Attending: Nephrology | Admitting: Nephrology

## 2010-12-08 ENCOUNTER — Other Ambulatory Visit (HOSPITAL_COMMUNITY): Payer: Self-pay | Admitting: Nephrology

## 2010-12-08 DIAGNOSIS — T82898A Other specified complication of vascular prosthetic devices, implants and grafts, initial encounter: Secondary | ICD-10-CM | POA: Insufficient documentation

## 2010-12-08 DIAGNOSIS — Y849 Medical procedure, unspecified as the cause of abnormal reaction of the patient, or of later complication, without mention of misadventure at the time of the procedure: Secondary | ICD-10-CM | POA: Insufficient documentation

## 2010-12-08 DIAGNOSIS — I120 Hypertensive chronic kidney disease with stage 5 chronic kidney disease or end stage renal disease: Secondary | ICD-10-CM | POA: Insufficient documentation

## 2010-12-08 DIAGNOSIS — Z992 Dependence on renal dialysis: Secondary | ICD-10-CM | POA: Insufficient documentation

## 2010-12-08 DIAGNOSIS — N186 End stage renal disease: Secondary | ICD-10-CM | POA: Insufficient documentation

## 2010-12-08 MED ORDER — IOHEXOL 300 MG/ML  SOLN
100.0000 mL | Freq: Once | INTRAMUSCULAR | Status: AC | PRN
  Administered 2010-12-08: 50 mL via INTRAVENOUS

## 2010-12-09 LAB — POCT I-STAT, CHEM 8
BUN: 56 mg/dL — ABNORMAL HIGH (ref 6–23)
Calcium, Ion: 1.23 mmol/L (ref 1.12–1.32)
Chloride: 110 mEq/L (ref 96–112)
Creatinine, Ser: 11.2 mg/dL — ABNORMAL HIGH (ref 0.50–1.10)
Glucose, Bld: 107 mg/dL — ABNORMAL HIGH (ref 70–99)
HCT: 41 % (ref 36.0–46.0)
Hemoglobin: 13.9 g/dL (ref 12.0–15.0)
Potassium: 4.9 mEq/L (ref 3.5–5.1)
Sodium: 142 mEq/L (ref 135–145)
TCO2: 21 mmol/L (ref 0–100)

## 2010-12-10 LAB — POCT I-STAT 4, (NA,K, GLUC, HGB,HCT)
Glucose, Bld: 129 mg/dL — ABNORMAL HIGH (ref 70–99)
HCT: 45 % (ref 36.0–46.0)
Hemoglobin: 15.3 g/dL — ABNORMAL HIGH (ref 12.0–15.0)
Potassium: 7.6 mEq/L (ref 3.5–5.1)
Sodium: 140 mEq/L (ref 135–145)

## 2010-12-14 ENCOUNTER — Emergency Department (HOSPITAL_COMMUNITY)
Admission: EM | Admit: 2010-12-14 | Discharge: 2010-12-14 | Disposition: A | Discharge status: Home / Self Care | Payer: Medicare Other | Attending: Emergency Medicine | Admitting: Emergency Medicine

## 2010-12-14 DIAGNOSIS — I959 Hypotension, unspecified: Secondary | ICD-10-CM | POA: Insufficient documentation

## 2010-12-14 DIAGNOSIS — H409 Unspecified glaucoma: Secondary | ICD-10-CM | POA: Insufficient documentation

## 2010-12-14 DIAGNOSIS — I120 Hypertensive chronic kidney disease with stage 5 chronic kidney disease or end stage renal disease: Secondary | ICD-10-CM | POA: Insufficient documentation

## 2010-12-14 DIAGNOSIS — Z79899 Other long term (current) drug therapy: Secondary | ICD-10-CM | POA: Insufficient documentation

## 2010-12-14 DIAGNOSIS — Z992 Dependence on renal dialysis: Secondary | ICD-10-CM | POA: Insufficient documentation

## 2010-12-14 DIAGNOSIS — J4489 Other specified chronic obstructive pulmonary disease: Secondary | ICD-10-CM | POA: Insufficient documentation

## 2010-12-14 DIAGNOSIS — R64 Cachexia: Secondary | ICD-10-CM | POA: Insufficient documentation

## 2010-12-14 DIAGNOSIS — J449 Chronic obstructive pulmonary disease, unspecified: Secondary | ICD-10-CM | POA: Insufficient documentation

## 2010-12-14 DIAGNOSIS — N186 End stage renal disease: Secondary | ICD-10-CM | POA: Insufficient documentation

## 2010-12-14 DIAGNOSIS — R42 Dizziness and giddiness: Secondary | ICD-10-CM | POA: Insufficient documentation

## 2010-12-14 LAB — POCT I-STAT, CHEM 8
BUN: 4 mg/dL — ABNORMAL LOW (ref 6–23)
Calcium, Ion: 1.06 mmol/L — ABNORMAL LOW (ref 1.12–1.32)
Chloride: 101 meq/L (ref 96–112)
Creatinine, Ser: 1.9 mg/dL — ABNORMAL HIGH (ref 0.50–1.10)
Glucose, Bld: 105 mg/dL — ABNORMAL HIGH (ref 70–99)
HCT: 31 % — ABNORMAL LOW (ref 36.0–46.0)
Hemoglobin: 10.5 g/dL — ABNORMAL LOW (ref 12.0–15.0)
Potassium: 2.9 meq/L — ABNORMAL LOW (ref 3.5–5.1)
Sodium: 141 meq/L (ref 135–145)
TCO2: 28 mmol/L (ref 0–100)

## 2011-01-16 ENCOUNTER — Encounter (HOSPITAL_COMMUNITY): Payer: Self-pay | Admitting: Anesthesiology

## 2011-01-16 ENCOUNTER — Emergency Department (HOSPITAL_COMMUNITY): Payer: Medicare Other | Admitting: Anesthesiology

## 2011-01-16 ENCOUNTER — Encounter (HOSPITAL_COMMUNITY)
Admission: EM | Disposition: A | Discharge status: Home / Self Care | Payer: Self-pay | Source: Home / Self Care | Attending: Emergency Medicine

## 2011-01-16 ENCOUNTER — Telehealth (HOSPITAL_COMMUNITY): Payer: Self-pay | Admitting: Emergency Medicine

## 2011-01-16 ENCOUNTER — Ambulatory Visit (HOSPITAL_COMMUNITY)
Admission: EM | Admit: 2011-01-16 | Discharge: 2011-01-16 | Disposition: A | Discharge status: Home / Self Care | Payer: Medicare Other | Attending: Emergency Medicine | Admitting: Emergency Medicine

## 2011-01-16 ENCOUNTER — Encounter (HOSPITAL_COMMUNITY): Payer: Self-pay | Admitting: *Deleted

## 2011-01-16 DIAGNOSIS — IMO0002 Reserved for concepts with insufficient information to code with codable children: Secondary | ICD-10-CM

## 2011-01-16 DIAGNOSIS — Z992 Dependence on renal dialysis: Secondary | ICD-10-CM | POA: Insufficient documentation

## 2011-01-16 DIAGNOSIS — I120 Hypertensive chronic kidney disease with stage 5 chronic kidney disease or end stage renal disease: Secondary | ICD-10-CM | POA: Insufficient documentation

## 2011-01-16 DIAGNOSIS — T859XXA Unspecified complication of internal prosthetic device, implant and graft, initial encounter: Secondary | ICD-10-CM

## 2011-01-16 DIAGNOSIS — Z01812 Encounter for preprocedural laboratory examination: Secondary | ICD-10-CM | POA: Insufficient documentation

## 2011-01-16 DIAGNOSIS — N186 End stage renal disease: Secondary | ICD-10-CM | POA: Insufficient documentation

## 2011-01-16 DIAGNOSIS — T82898A Other specified complication of vascular prosthetic devices, implants and grafts, initial encounter: Secondary | ICD-10-CM

## 2011-01-16 DIAGNOSIS — Y832 Surgical operation with anastomosis, bypass or graft as the cause of abnormal reaction of the patient, or of later complication, without mention of misadventure at the time of the procedure: Secondary | ICD-10-CM | POA: Insufficient documentation

## 2011-01-16 HISTORY — PX: THROMBECTOMY W/ EMBOLECTOMY: SHX2507

## 2011-01-16 LAB — POCT I-STAT, CHEM 8
Calcium, Ion: 1.1 mmol/L — ABNORMAL LOW (ref 1.12–1.32)
Chloride: 99 mEq/L (ref 96–112)
Glucose, Bld: 100 mg/dL — ABNORMAL HIGH (ref 70–99)
HCT: 36 % (ref 36.0–46.0)

## 2011-01-16 LAB — CBC
MCH: 33 pg (ref 26.0–34.0)
MCHC: 32.1 g/dL (ref 30.0–36.0)
Platelets: 230 10*3/uL (ref 150–400)

## 2011-01-16 LAB — PROTIME-INR: Prothrombin Time: 13.6 seconds (ref 11.6–15.2)

## 2011-01-16 LAB — DIFFERENTIAL
Basophils Relative: 0 % (ref 0–1)
Eosinophils Absolute: 0.1 10*3/uL (ref 0.0–0.7)
Monocytes Relative: 10 % (ref 3–12)
Neutrophils Relative %: 74 % (ref 43–77)

## 2011-01-16 LAB — APTT: aPTT: 28 seconds (ref 24–37)

## 2011-01-16 SURGERY — THROMBECTOMY ARTERIOVENOUS GORE-TEX GRAFT
Anesthesia: Monitor Anesthesia Care | Site: Arm Upper | Laterality: Left | Wound class: Clean

## 2011-01-16 MED ORDER — PROTAMINE SULFATE 10 MG/ML IV SOLN
INTRAVENOUS | Status: DC | PRN
  Administered 2011-01-16: 50 mg via INTRAVENOUS

## 2011-01-16 MED ORDER — MORPHINE SULFATE 2 MG/ML IJ SOLN: 0.0500 mg/kg | INTRAMUSCULAR | Status: DC | PRN

## 2011-01-16 MED ORDER — HYDROMORPHONE HCL PF 1 MG/ML IJ SOLN: 0.2500 mg | INTRAMUSCULAR | Status: DC | PRN

## 2011-01-16 MED ORDER — MEPERIDINE HCL 25 MG/ML IJ SOLN: 6.2500 mg | INTRAMUSCULAR | Status: DC | PRN

## 2011-01-16 MED ORDER — LIDOCAINE-EPINEPHRINE (PF) 1 %-1:200000 IJ SOLN
INTRAMUSCULAR | Status: DC | PRN
  Administered 2011-01-16: 11 mL via INTRADERMAL

## 2011-01-16 MED ORDER — CEFAZOLIN SODIUM 1-5 GM-% IV SOLN
INTRAVENOUS | Status: DC | PRN
  Administered 2011-01-16: 1 g via INTRAVENOUS

## 2011-01-16 MED ORDER — HEPARIN SODIUM (PORCINE) 1000 UNIT/ML IJ SOLN
INTRAMUSCULAR | Status: DC | PRN
  Administered 2011-01-16: 3000 [IU] via INTRAVENOUS

## 2011-01-16 MED ORDER — MIDAZOLAM HCL 2 MG/2ML IJ SOLN: 0.5000 mg | Freq: Once | INTRAMUSCULAR | Status: DC | PRN

## 2011-01-16 MED ORDER — PROMETHAZINE HCL 25 MG/ML IJ SOLN: 6.2500 mg | INTRAMUSCULAR | Status: DC | PRN

## 2011-01-16 MED ORDER — HEMOSTATIC AGENTS (NO CHARGE) OPTIME
TOPICAL | Status: DC | PRN
  Administered 2011-01-16: 1 via TOPICAL

## 2011-01-16 MED ORDER — SODIUM CHLORIDE 0.9 % IV SOLN
INTRAVENOUS | Status: DC | PRN
  Administered 2011-01-16: 19:00:00 via INTRAVENOUS

## 2011-01-16 MED ORDER — PROPOFOL 10 MG/ML IV EMUL
INTRAVENOUS | Status: DC | PRN
  Administered 2011-01-16: 50 ug/kg/min via INTRAVENOUS

## 2011-01-16 MED ORDER — OXYCODONE-ACETAMINOPHEN 5-325 MG PO TABS: 1.0000 | ORAL_TABLET | ORAL | Status: AC | PRN

## 2011-01-16 SURGICAL SUPPLY — 48 items
ADH SKN CLS APL DERMABOND .7 (GAUZE/BANDAGES/DRESSINGS) ×1
BANDAGE ELASTIC 4 VELCRO ST LF (GAUZE/BANDAGES/DRESSINGS) IMPLANT
BANDAGE GAUZE ELAST BULKY 4 IN (GAUZE/BANDAGES/DRESSINGS) ×1 IMPLANT
CANISTER SUCTION 2500CC (MISCELLANEOUS) ×2 IMPLANT
CATH EMB 4FR 80CM (CATHETERS) ×2 IMPLANT
CLIP TI MEDIUM 6 (CLIP) ×2 IMPLANT
CLIP TI WIDE RED SMALL 6 (CLIP) ×2 IMPLANT
CLOTH BEACON ORANGE TIMEOUT ST (SAFETY) ×2 IMPLANT
COVER SURGICAL LIGHT HANDLE (MISCELLANEOUS) ×4 IMPLANT
DERMABOND ADVANCED (GAUZE/BANDAGES/DRESSINGS) ×1
DERMABOND ADVANCED .7 DNX12 (GAUZE/BANDAGES/DRESSINGS) ×1 IMPLANT
ELECT REM PT RETURN 9FT ADLT (ELECTROSURGICAL) ×2
ELECTRODE REM PT RTRN 9FT ADLT (ELECTROSURGICAL) ×1 IMPLANT
GEL ULTRASOUND 20GR AQUASONIC (MISCELLANEOUS) IMPLANT
GLOVE BIOGEL PI IND STRL 6.5 (GLOVE) IMPLANT
GLOVE BIOGEL PI IND STRL 7.0 (GLOVE) IMPLANT
GLOVE BIOGEL PI IND STRL 7.5 (GLOVE) ×1 IMPLANT
GLOVE BIOGEL PI INDICATOR 6.5 (GLOVE) ×1
GLOVE BIOGEL PI INDICATOR 7.0 (GLOVE) ×1
GLOVE BIOGEL PI INDICATOR 7.5 (GLOVE) ×1
GLOVE ECLIPSE 7.0 STRL STRAW (GLOVE) ×1 IMPLANT
GLOVE ORTHOPEDIC STR SZ6.5 (GLOVE) ×1 IMPLANT
GLOVE SURG SS PI 7.5 STRL IVOR (GLOVE) ×2 IMPLANT
GOWN PREVENTION PLUS XXLARGE (GOWN DISPOSABLE) ×2 IMPLANT
GOWN STRL NON-REIN LRG LVL3 (GOWN DISPOSABLE) ×4 IMPLANT
GRAFT GORETEX 6X10 (Vascular Products) ×1 IMPLANT
HEMOSTAT SNOW SURGICEL 2X4 (HEMOSTASIS) IMPLANT
HEMOSTAT SURGICEL 2X14 (HEMOSTASIS) IMPLANT
KIT BASIN OR (CUSTOM PROCEDURE TRAY) ×2 IMPLANT
KIT ROOM TURNOVER OR (KITS) ×2 IMPLANT
NDL HYPO 25GX1X1/2 BEV (NEEDLE) IMPLANT
NEEDLE HYPO 25GX1X1/2 BEV (NEEDLE) ×2 IMPLANT
NS IRRIG 1000ML POUR BTL (IV SOLUTION) ×2 IMPLANT
PACK CV ACCESS (CUSTOM PROCEDURE TRAY) ×2 IMPLANT
PAD ARMBOARD 7.5X6 YLW CONV (MISCELLANEOUS) ×2 IMPLANT
SLEEVE SURGEON STRL (DRAPES) ×1 IMPLANT
SPONGE GAUZE 4X4 12PLY (GAUZE/BANDAGES/DRESSINGS) ×1 IMPLANT
SUT GORETEX 6.0 TT9 (SUTURE) ×2 IMPLANT
SUT PROLENE 5 0 C 1 24 (SUTURE) ×2 IMPLANT
SUT PROLENE 6 0 BV (SUTURE) ×2 IMPLANT
SUT VIC AB 3-0 SH 27 (SUTURE) ×4
SUT VIC AB 3-0 SH 27X BRD (SUTURE) ×1 IMPLANT
SUT VICRYL 4-0 PS2 18IN ABS (SUTURE) ×1 IMPLANT
SYR CONTROL 10ML LL (SYRINGE) ×1 IMPLANT
TOWEL OR 17X24 6PK STRL BLUE (TOWEL DISPOSABLE) ×2 IMPLANT
TOWEL OR 17X26 10 PK STRL BLUE (TOWEL DISPOSABLE) ×2 IMPLANT
UNDERPAD 30X30 INCONTINENT (UNDERPADS AND DIAPERS) ×2 IMPLANT
WATER STERILE IRR 1000ML POUR (IV SOLUTION) ×2 IMPLANT

## 2011-01-16 NOTE — H&P (Signed)
[facility]      Consult Note  Patient name: [name] MRN: [id number] DOB: [birth year] Sex: female  Consulting Physician:  Bleeding left upper arm dialysis graft  Reason for Consult:  Chief Complaint  Patient presents with  . Bleeding/Bruising    HISTORY OF PRESENT ILLNESS: The patient presents to the emergency department today with a 24-hour history of bleeding following dialysis access. The bleeding has been constant. It was pulsatile yesterday but is now just oozing. She has been hemodynamically stable. Earlier in February of this year she had a similar problem and a new graft was placed peripheral to her old graft. She denies having fevers or chills. She has been keeping a compression dressing over the wound  Past Medical History  Diagnosis Date  . Anemia   . Cystoid macular edema   . Dialysis patient   . Cataract     h/o  . Glaucoma   . Hypertension   . Renal insufficiency     Past Surgical History  Procedure Date  . Abdominal hysterectomy   . Appendectomy   . Cataract extraction   . Arteriovenous graft placement     History   Social History  . Marital Status: Widowed    Spouse Name: N/A    Number of Children: N/A  . Years of Education: N/A   Occupational History  . Not on file.   Social History Main Topics  . Smoking status: Never Smoker   . Smokeless tobacco: Not on file  . Alcohol Use: No  . Drug Use: No  . Sexually Active:    Other Topics Concern  . Not on file   Social History Narrative  . No narrative on file    No family history on file.  Allergies as of 01/16/2011  . (No Known Allergies)    No current facility-administered medications on file prior to encounter.   Current Outpatient Prescriptions on File Prior to Encounter  Medication Sig Dispense Refill  . Albuterol Sulfate (PROAIR HFA IN) Inhale 1-2 puffs into the lungs every 4 (four) hours as needed. For shortness of breath      . b  complex-vitamin c-folic acid (NEPHRO-VITE) 0.8 MG TABS Take 0.8 mg by mouth at bedtime.       . dipyridamole-aspirin (AGGRENOX) 25-200 MG per 12 hr capsule Take 1 capsule by mouth daily.        . dorzolamide (TRUSOPT) 2 % ophthalmic solution Place 1 drop into both eyes 2 (two) times daily.        Marland Kitchen latanoprost (XALATAN) 0.005 % ophthalmic solution 1 drop at bedtime.        . megestrol (MEGACE) 40 MG/ML suspension Take 200 mg by mouth 2 (two) times daily.        . protein supplement (RESOURCE BENEPROTEIN) 6 g POWD Take 1 scoop by mouth 3 (three) times daily with meals.        . timolol (BETIMOL) 0.5 % ophthalmic solution Place 1 drop into both eyes 2 (two) times daily.           REVIEW OF SYSTEMS: No chest pain no shortness of breath no fevers all of systems negative  PHYSICAL EXAMINATION: General: The patient appears their stated age.  Vital signs are BP 95/64  Pulse 88  Temp(Src) 96.6 F (35.9 C) (Oral)  Resp 18  SpO2 100% Pulmonary: Respirations are non-labored Musculoskeletal: There are no major deformities.   Neurologic: No focal weakness or paresthesias  are detected, Skin: There are no ulcer or rashes noted. Psychiatric: The patient has normal affect. Cardiovascular: Good thrill within her graft. There is a small skin opening with oozing in the midportion of the graft    Assessment:  Bleeding left upper arm dialysis graft Plan: Patient is to be taken to the operating room for repair. I believe back in tunnel a new segment of graft lateral to her existing graft and preserve her ability to dialyze through this. This was discussed with the patient and her family we will proceed to the operating room     V. Charlena Cross, M.D. [facility] [place] Office: [phone] Pager:  [phone]

## 2011-01-16 NOTE — Interval H&P Note (Signed)
History and Physical Interval Note:   01/16/2011   6:35 PM   [name]  has presented today for surgery, with the diagnosis of Bleeding  The various methods of treatment have been discussed with the patient and family. After consideration of risks, benefits and other options for treatment, the patient has consented to  Procedure(s): revision ARTERIOVENOUS GORE-TEX GRAFT as a surgical intervention .  The patients' history has been reviewed, patient examined, no change in status, stable for surgery.  I have reviewed the patients' chart and labs.  Questions were answered to the patient's satisfaction.     Jorge Ny  MD

## 2011-01-16 NOTE — Op Note (Signed)
[facility]  Patient name: [name] MRN: [id number] DOB: [birth year] Sex: female  01/16/2011 Pre-operative Diagnosis: Left upper arm dialysis graft pseudoaneurysm Post-operative diagnosis:  Same Surgeon:  Jorge Ny Assistants:    Procedure:   Revision of left upper arm graft with interposition 6 mm Gore-Tex Anesthesia:  MAC none Blood Loss:  See anesthesia record Specimens:  None  Findings:  I placed an interposition graft lateral to the existing graft with a ulceration was  Indications:  The patient presented to the emergency department with continued bleeding from her left upper arm dialysis graft. There was a skin defect over the graft. I felt this needed to be revised. The procedure was discussed with the patient's daughter and patient and they wished to proceed  Procedure:  The patient was identified in the holding area and taken to [facility] OR ROOM 06  The patient was then placed supine on the table. IV sedation anesthesia was administered.  The patient was prepped and draped in the usual sterile fashion.  A time out was called and antibiotics were administered.  One percent lidocaine was used for local anesthesia. I made a transverse incision across the graft proximal and distal to the area of ulceration the graft was fully mobilized within these 2 incisions. Patient was then given 4000 units of heparin. The graft was then occluded proximally and distally. I then transected the graft in 2 spots. The defunctionalized limb was oversewn with 5-0 Prolene in 2 layers. I then created a tunnel lateral to the existing graft with a Kelly clamp. A 6 mm Gore-Tex graft was brought through the tunnel. The new graft was anastomosed to the old graft proximally and distally. Both anastomoses were sewn with running CV 6 Gore-Tex suture. Prior to completion graft was appropriately flushed and the anastomosis were completed. Clamps released. This did flow to the graft  the patient's heparin was reversed with 50 mg of protamine once hemostasis was adequate the incisions were closed with 2layers of 3-0 Vicryl. Dermabond placed on the wound the arm was wrapped in Kerlix   Disposition:  To PACU in stable condition.   Jorge Ny [facility] [place] Office: [phone] Pager:  [phone]

## 2011-01-16 NOTE — ED Provider Notes (Cosign Needed)
History     CSN: [id number] Arrival date & time: 01/16/2011  3:21 PM   First MD Initiated Contact with Patient 01/16/11 1557      Chief Complaint  Patient presents with  . Bleeding/Bruising   She obtained from patient's daughter. (Consider location/radiation/quality/duration/timing/severity/associated sxs/prior treatment) HPI  Patient has end-stage renal disease and gets dialysis on Monday Wednesday Friday. Daughter states she used to have a fistula in her left arm but she had a left graft placed about 6 months ago. She relates recently has started bleeding after her dialysis. Yesterday she had some bleeding that they were able to control and they were instructed that if it re\re bled today they should come to the emergency department and have the vascular surgeon reevaluate the area. Daughter states today about 1:00 she noticed there was some blood on her dressings and when she removed it there was some pulsating bleeding which was controlled with pressure. When asked how she feels patient states she feels okay. Her also reports she's had some problems with hypotension during her dialysis.  Primary care physician Dr. Tanya Nones at [facility] family practice Dialysis doctor Dr. Caryn Section  Past Medical History  Diagnosis Date  . Anemia   . Cystoid macular edema   . Dialysis patient   . Cataract     h/o  . Glaucoma   . Hypertension   . Renal insufficiency    no failure from long-standing hypertension  Past Surgical History  Procedure Date  . Abdominal hysterectomy   . Appendectomy   . Cataract extraction   . Arteriovenous graft placement     No family history on file.  History  Substance Use Topics  . Smoking status: Never Smoker   . Smokeless tobacco: Not on file  . Alcohol Use: No   lives with daughter  OB History    Grav Para Term Preterm Abortions TAB SAB Ect Mult Living                  Review of Systems  All other systems reviewed and are  negative.    Allergies  Review of patient's allergies indicates no known allergies.  Home Medications   Current Outpatient Rx  Name Route Sig Dispense Refill  . PROAIR HFA IN Inhalation Inhale into the lungs.      . NEPHRO-VITE 0.8 MG PO TABS Oral Take 0.8 mg by mouth at bedtime.      . ASPIRIN-DIPYRIDAMOLE 25-200 MG PO CP12 Oral Take 1 capsule by mouth daily.      . DORZOLAMIDE HCL 2 % OP SOLN Both Eyes Place 1 drop into both eyes 2 (two) times daily.      Marland Kitchen ESOMEPRAZOLE MAGNESIUM 40 MG PO CPDR Oral Take 40 mg by mouth daily before breakfast.      . LATANOPROST 0.005 % OP SOLN  1 drop at bedtime.      . MEGESTROL ACETATE 40 MG/ML PO SUSP Oral Take 200 mg by mouth 2 (two) times daily.      . RESOURCE INSTANT PROTEIN PO PWD PACKET Oral Take 1 scoop by mouth 3 (three) times daily with meals.      Marland Kitchen TIMOLOL HEMIHYDRATE 0.5 % OP SOLN Both Eyes Place 1 drop into both eyes 2 (two) times daily.      . TRAMADOL HCL 50 MG PO TABS Oral Take 50 mg by mouth every 8 (eight) hours as needed.        BP 95/64  Pulse 88  Temp(Src) 96.6 F (35.9 C) (Oral)  Resp 18  SpO2 100%  Vital signs show mild hypotension.  Physical Exam  Vitals reviewed. Constitutional: She appears well-developed and well-nourished.       Patient is noted to be lying looking to her right side very listless with a very flat affect  HENT:  Head: Normocephalic and atraumatic.  Eyes: Conjunctivae are normal. Pupils are equal, round, and reactive to light.  Musculoskeletal:       Patient has a graft in her left upper [place]. It is wrapped with a white paper tape. When I unwrap it there is a small amount of blood on the gauze. She appears to have 1 bruising area it appears she's had 2 puncture sites close together. There is a slow oozing there is no pulsating or brisk bleeding noted. Do not feel a thrill however she does have a bruit when auscultated  Skin: Skin is warm and dry.  Psychiatric:       If flat affect    ED  Course  Procedures (including critical care time)  Results for orders placed during the hospital encounter of 01/16/11  CBC      Component Value Range   WBC 11.1 (*) 4.0 - 10.5 (K/uL)   RBC 3.49 (*) 3.87 - 5.11 (MIL/uL)   Hemoglobin 11.5 (*) 12.0 - 15.0 (g/dL)   HCT 16.1 (*) 09.6 - 46.0 (%)   MCV 102.6 (*) 78.0 - 100.0 (fL)   MCH 33.0  26.0 - 34.0 (pg)   MCHC 32.1  30.0 - 36.0 (g/dL)   RDW 04.5 (*) 40.9 - 15.5 (%)   Platelets 230  150 - 400 (K/uL)  DIFFERENTIAL      Component Value Range   Neutrophils Relative 74  43 - 77 (%)   Neutro Abs 8.1 (*) 1.7 - 7.7 (K/uL)   Lymphocytes Relative 16  12 - 46 (%)   Lymphs Abs 1.8  0.7 - 4.0 (K/uL)   Monocytes Relative 10  3 - 12 (%)   Monocytes Absolute 1.1 (*) 0.1 - 1.0 (K/uL)   Eosinophils Relative 1  0 - 5 (%)   Eosinophils Absolute 0.1  0.0 - 0.7 (K/uL)   Basophils Relative 0  0 - 1 (%)   Basophils Absolute 0.0  0.0 - 0.1 (K/uL)  APTT      Component Value Range   aPTT 28  24 - 37 (seconds)  PROTIME-INR      Component Value Range   Prothrombin Time 13.6  11.6 - 15.2 (seconds)   INR 1.02  0.00 - 1.49   POCT I-STAT, CHEM 8      Component Value Range   Sodium 139  135 - 145 (mEq/L)   Potassium 3.4 (*) 3.5 - 5.1 (mEq/L)   Chloride 99  96 - 112 (mEq/L)   BUN 21  6 - 23 (mg/dL)   Creatinine, Ser 8.11 (*) 0.50 - 1.10 (mg/dL)   Glucose, Bld 914 (*) 70 - 99 (mg/dL)   Calcium, Ion 7.82 (*) 1.12 - 1.32 (mmol/L)   TCO2 28  0 - 100 (mmol/L)   Hemoglobin 12.2  12.0 - 15.0 (g/dL)   HCT 95.6  21.3 - 08.6 (%)    Laboratory interpretation normal coagulation. Renal failure, minimal anemia    1716 p.m. Dr Myra Gianotti will see patient in ED.    Dr. Myra Gianotti took patient to OR to repair her graft.   Diagnoses that have been ruled out:  Diagnoses that are still  under consideration:  Final diagnoses:  Complication of device or graft   Devoria Albe, MD, FACEP   MDM          Ward Givens, MD 01/16/11 [phone]

## 2011-01-16 NOTE — ED Notes (Signed)
Waiting for peripheral vascular dr. To evaluate.

## 2011-01-16 NOTE — Consult Note (Signed)
[facility] [place]      Consult Note  Patient name: [name] MRN: [id number] DOB: 10/20/1919 Sex: female  Consulting Physician:  Bleeding left upper arm dialysis graft  Reason for Consult:  Chief Complaint  Patient presents with  . Bleeding/Bruising    HISTORY OF PRESENT ILLNESS: The patient presents to the emergency department today with a 24-hour history of bleeding following dialysis access. The bleeding has been constant. It was pulsatile yesterday but is now just oozing. She has been hemodynamically stable. Earlier in February of this year she had a similar problem and a new graft was placed peripheral to her old graft. She denies having fevers or chills. She has been keeping a compression dressing over the wound  Past Medical History  Diagnosis Date  . Anemia   . Cystoid macular edema   . Dialysis patient   . Cataract     h/o  . Glaucoma   . Hypertension   . Renal insufficiency     Past Surgical History  Procedure Date  . Abdominal hysterectomy   . Appendectomy   . Cataract extraction   . Arteriovenous graft placement     History   Social History  . Marital Status: Widowed    Spouse Name: N/A    Number of Children: N/A  . Years of Education: N/A   Occupational History  . Not on file.   Social History Main Topics  . Smoking status: Never Smoker   . Smokeless tobacco: Not on file  . Alcohol Use: No  . Drug Use: No  . Sexually Active:    Other Topics Concern  . Not on file   Social History Narrative  . No narrative on file    No family history on file.  Allergies as of 01/16/2011  . (No Known Allergies)    No current facility-administered medications on file prior to encounter.   Current Outpatient Prescriptions on File Prior to Encounter  Medication Sig Dispense Refill  . Albuterol Sulfate (PROAIR HFA IN) Inhale 1-2 puffs into the lungs every 4 (four) hours as needed. For shortness of breath      . b  complex-vitamin c-folic acid (NEPHRO-VITE) 0.8 MG TABS Take 0.8 mg by mouth at bedtime.       . dipyridamole-aspirin (AGGRENOX) 25-200 MG per 12 hr capsule Take 1 capsule by mouth daily.        . dorzolamide (TRUSOPT) 2 % ophthalmic solution Place 1 drop into both eyes 2 (two) times daily.        . latanoprost (XALATAN) 0.005 % ophthalmic solution 1 drop at bedtime.        . megestrol (MEGACE) 40 MG/ML suspension Take 200 mg by mouth 2 (two) times daily.        . protein supplement (RESOURCE BENEPROTEIN) 6 g POWD Take 1 scoop by mouth 3 (three) times daily with meals.        . timolol (BETIMOL) 0.5 % ophthalmic solution Place 1 drop into both eyes 2 (two) times daily.           REVIEW OF SYSTEMS: No chest pain no shortness of breath no fevers all of systems negative  PHYSICAL EXAMINATION: General: The patient appears their stated age.  Vital signs are BP 95/64  Pulse 88  Temp(Src) 96.6 F (35.9 C) (Oral)  Resp 18  SpO2 100% Pulmonary: Respirations are non-labored Musculoskeletal: There are no major deformities.   Neurologic: No focal weakness or paresthesias   are detected, Skin: There are no ulcer or rashes noted. Psychiatric: The patient has normal affect. Cardiovascular: Good thrill within her graft. There is a small skin opening with oozing in the midportion of the graft    Assessment:  Bleeding left upper arm dialysis graft Plan: Patient is to be taken to the operating room for repair. I believe back in tunnel a new segment of graft lateral to her existing graft and preserve her ability to dialyze through this. This was discussed with the patient and her family we will proceed to the operating room     V. Wells Brabham IV, M.D. [facility] [place] Office: [phone] Pager:  [phone] 

## 2011-01-16 NOTE — ED Notes (Signed)
Family at bedside. 

## 2011-01-16 NOTE — Transfer of Care (Signed)
Immediate Anesthesia Transfer of Care Note  Patient: [name]  Procedure(s) Performed:  THROMBECTOMY ARTERIOVENOUS GORE-TEX GRAFT - repair of left arm graft bleed and revision of left arm graft.  Patient Location: PACU  Anesthesia Type: MAC  Level of Consciousness: awake  Airway & Oxygen Therapy: Patient Spontanous Breathing  Post-op Assessment: Post -op Vital signs reviewed and stable  Post vital signs: stable  Complications: No apparent anesthesia complications

## 2011-01-16 NOTE — Anesthesia Preprocedure Evaluation (Addendum)
Anesthesia Evaluation  Patient identified by MRN, date of birth, ID band Patient awake    Reviewed: Allergy & Precautions, H&P , NPO status , Patient's Chart, lab work & pertinent test results  Airway Mallampati: II      Dental   Pulmonary  clear to auscultation        Cardiovascular hypertension, Regular     Neuro/Psych    GI/Hepatic   Endo/Other    Renal/GU CRFRenal disease     Musculoskeletal   Abdominal   Peds  Hematology   Anesthesia Other Findings   Reproductive/Obstetrics                           Anesthesia Physical Anesthesia Plan  ASA: III and Emergent  Anesthesia Plan: MAC   Post-op Pain Management:    Induction: Intravenous  Airway Management Planned: Simple Face Mask  Additional Equipment:   Intra-op Plan:   Post-operative Plan:   Informed Consent:   Dental advisory given  Plan Discussed with: CRNA and Anesthesiologist  Anesthesia Plan Comments:       Anesthesia Quick Evaluation

## 2011-01-16 NOTE — Transfer of Care (Signed)
Immediate Anesthesia Transfer of Care Note  Patient: [name]  Procedure(s) Performed:  THROMBECTOMY ARTERIOVENOUS GORE-TEX GRAFT - repair of left arm graft bleed and revision of left arm graft.  Patient Location: PACU  Anesthesia Type: MAC  Level of Consciousness: awake and patient cooperative  Airway & Oxygen Therapy: Patient Spontanous Breathing  Post-op Assessment: Report given to PACU RN, Post -op Vital signs reviewed and stable and Patient moving all extremities X 4  Post vital signs: Reviewed and stable  Complications: No apparent anesthesia complications

## 2011-01-16 NOTE — ED Notes (Signed)
Patient was at home with family,  Patient reported to have bleeding from the graft in the left arm this week.  Patient with onset of bleeding today,  Controlled by family when EMS arrived.  Patient with no complaints.  Patient vss.

## 2011-01-16 NOTE — Anesthesia Postprocedure Evaluation (Signed)
  Anesthesia Post-op Note  Patient: [name]  Procedure(s) Performed:  THROMBECTOMY ARTERIOVENOUS GORE-TEX GRAFT - repair of left arm graft bleed and revision of left arm graft.  Patient Location: PACU  Anesthesia Type: MAC  Level of Consciousness: awake  Airway and Oxygen Therapy: Patient Spontanous Breathing  Post-op Pain: mild  Post-op Assessment: Post-op Vital signs reviewed  Post-op Vital Signs: stable  Complications: No apparent anesthesia complications

## 2011-01-16 NOTE — ED Notes (Signed)
I gave the patient a warm blanket. 

## 2011-01-19 ENCOUNTER — Encounter (HOSPITAL_COMMUNITY): Payer: Self-pay | Admitting: Surgery

## 2011-01-29 ENCOUNTER — Emergency Department (HOSPITAL_COMMUNITY)
Admission: EM | Admit: 2011-01-29 | Discharge: 2011-01-29 | Disposition: A | Discharge status: Home / Self Care | Payer: Medicare Other | Attending: Emergency Medicine | Admitting: Emergency Medicine

## 2011-01-29 ENCOUNTER — Encounter (HOSPITAL_COMMUNITY): Payer: Self-pay | Admitting: Emergency Medicine

## 2011-01-29 DIAGNOSIS — I10 Essential (primary) hypertension: Secondary | ICD-10-CM | POA: Insufficient documentation

## 2011-01-29 DIAGNOSIS — Z9889 Other specified postprocedural states: Secondary | ICD-10-CM | POA: Insufficient documentation

## 2011-01-29 DIAGNOSIS — M79609 Pain in unspecified limb: Secondary | ICD-10-CM | POA: Insufficient documentation

## 2011-01-29 DIAGNOSIS — L988 Other specified disorders of the skin and subcutaneous tissue: Secondary | ICD-10-CM | POA: Insufficient documentation

## 2011-01-29 DIAGNOSIS — Z79899 Other long term (current) drug therapy: Secondary | ICD-10-CM | POA: Insufficient documentation

## 2011-01-29 DIAGNOSIS — Z992 Dependence on renal dialysis: Secondary | ICD-10-CM | POA: Insufficient documentation

## 2011-01-29 DIAGNOSIS — R000 Tachycardia, unspecified: Secondary | ICD-10-CM | POA: Insufficient documentation

## 2011-01-29 DIAGNOSIS — Z139 Encounter for screening, unspecified: Secondary | ICD-10-CM

## 2011-01-29 NOTE — ED Provider Notes (Addendum)
History     CSN: [id number] Arrival date & time: 01/29/2011  2:29 PM   First MD Initiated Contact with Patient 01/29/11 1603      Chief Complaint  Patient presents with  . Arm Pain    (Consider location/radiation/quality/duration/timing/severity/associated sxs/prior treatment) HPI Comments: Patient is a dialysis patient with history of a graft in her left upper extremity. Dr. Myra Gianotti with vascular surgery revised the graft on November 17. She has been having dialysis with her site, most recently on Wednesday. Yesterday the appearance of the graft area was very swollen with significant bruising and occasionally some old appearing blood with leak from a small lesion at the base of the graft site. There is no current bleeding now. The family called the vascular office yesterday starting at 9 AM. They called several times and finally at about 4 PM yesterday, a nurse called them back and told him that she would call them back today morning with further instructions after she had spoken to one of the vascular surgeons. This morning the family was called around 10:00 and told to come to the emergency department. Patient denies any upper gym the pain. She denies any chest pain or shortness of breath. She has not been running fevers according to the grandson. She denies any weakness to her left arm. Unfortunately they missed dialysis today which was scheduled at 12 noon because she was here in the emergency department.  Patient is a 77 y.o. female presenting with arm pain. The history is provided by the patient and a relative.  Arm Pain Pertinent negatives include no chest pain and no shortness of breath.    Past Medical History  Diagnosis Date  . Anemia   . Cystoid macular edema   . Dialysis patient   . Cataract     h/o  . Glaucoma   . Hypertension   . Renal insufficiency     Past Surgical History  Procedure Date  . Abdominal hysterectomy   . Appendectomy   . Cataract extraction   .  Arteriovenous graft placement   . Thrombectomy w/ embolectomy 01/16/2011    Procedure: THROMBECTOMY ARTERIOVENOUS GORE-TEX GRAFT;  Surgeon: Juleen China, MD;  Location: [facility] OR;  Service: Vascular;  Laterality: Left;  repair of left arm graft bleed and revision of left arm graft.    History reviewed. No pertinent family history.  History  Substance Use Topics  . Smoking status: Never Smoker   . Smokeless tobacco: Not on file  . Alcohol Use: No    OB History    Grav Para Term Preterm Abortions TAB SAB Ect Mult Living                  Review of Systems  Constitutional: Negative.  Negative for fever.  Respiratory: Negative for chest tightness and shortness of breath.   Cardiovascular: Negative for chest pain.  Musculoskeletal: Negative for arthralgias.  Skin: Positive for color change. Negative for rash.  Neurological: Negative for weakness and numbness.    Allergies  Review of patient's allergies indicates no known allergies.  Home Medications   Current Outpatient Rx  Name Route Sig Dispense Refill  . PROAIR HFA IN Inhalation Inhale 1-2 puffs into the lungs every 4 (four) hours as needed. For shortness of breath    . NEPHRO-VITE 0.8 MG PO TABS Oral Take 0.8 mg by mouth daily.     . ASPIRIN-DIPYRIDAMOLE 25-200 MG PO CP12 Oral Take 1 capsule by mouth daily.     Marland Kitchen  DORZOLAMIDE HCL 2 % OP SOLN Both Eyes Place 1 drop into both eyes 2 (two) times daily.      Marland Kitchen FAMOTIDINE 20 MG PO TABS Oral Take 20 mg by mouth at bedtime.      Marland Kitchen LATANOPROST 0.005 % OP SOLN Both Eyes Place 1 drop into both eyes at bedtime.     . MEGESTROL ACETATE 40 MG/ML PO SUSP Oral Take 200 mg by mouth 2 (two) times daily.      Marland Kitchen MIDODRINE HCL 10 MG PO TABS Oral Take 10 mg by mouth 3 (three) times a week. Takes 1 tablet prior to each HD treatments on Monday Wednesday and friday    . K PHOS MONO-SOD PHOS DI & MONO 155-852-130 MG PO TABS Oral Take 1 tablet by mouth 2 (two) times daily.      . RESOURCE INSTANT  PROTEIN PO PWD PACKET Oral Take 1 scoop by mouth 3 (three) times daily with meals.      Marland Kitchen TIMOLOL HEMIHYDRATE 0.5 % OP SOLN Both Eyes Place 1 drop into both eyes 2 (two) times daily.        BP 105/70  Pulse 115  Temp 98 F (36.7 C)  Resp 18  SpO2 100%  Physical Exam  Nursing note and vitals reviewed. Constitutional: She appears well-developed and well-nourished.  HENT:  Head: Normocephalic.  Eyes: Pupils are equal, round, and reactive to light. No scleral icterus.  Cardiovascular: Intact distal pulses.  Tachycardia present.  Exam reveals no decreased pulses.   No murmur heard. Pulmonary/Chest: Effort normal and breath sounds normal.  Abdominal: Soft.  Musculoskeletal: She exhibits no tenderness.       Arms: Neurological: She is alert. She has normal strength. No sensory deficit.  Skin: Skin is warm and dry. No rash noted.    ED Course  Procedures (including critical care time)  Labs Reviewed - No data to display No results found.   No diagnosis found.    MDM    Patient is afebrile, no complaints of left upper extremity pain. She has no chest pain, pleurisy. Her room air saturations are 100% which is normal. She has good distal grip strength and gross sensation of her left hand is normal. She is palpable thrills along her graft area. There is no active bleeding, areas of induration or fluctuance and no purulent discharge. I do not see any active bleeding at this time. I've spoken to Dr. Hart Rochester with vascular surgery who does not feel that he needs to see this at this time based on my description. He feels the patient can be rechecked by Dr. Forrestine Him on Monday if needed. Otherwise she'll need to be rescheduled for her dialysis that she missed today.     Dr. Kathrene Bongo spoke to pt's dialysis center and has an appt for her tomorrow at 12 noon.  I have recommended that the facility physician possibly recheck her site there as well.    Gavin Pound. Oletta Lamas, MD 01/29/11  1745  Gavin Pound. Dorotea Hand, MD 01/29/11 1746

## 2011-01-29 NOTE — ED Notes (Signed)
Family at bedside. 

## 2011-01-29 NOTE — ED Notes (Signed)
Dialysis graft revision 11-17th. Dialysis M-W-F. Did not go today. C/o edematous area, large hematoma to LUE. +bruit & thrill. Reports had no problems with dialysis on Monday & Wed. Called Surgeon today & was instructed to go to ED. Pt denies any pain, fever, chills. +left radial pulse

## 2011-01-29 NOTE — ED Notes (Signed)
Pt undressed and put in gown, noted bruising to anterior chest and large swelling to left arm at graft site. Warm to touch and skin is bruised and tight.

## 2011-01-29 NOTE — ED Notes (Signed)
Patient is resting comfortably. 

## 2011-01-29 NOTE — ED Notes (Signed)
Pt c/o left arm pain and swelling after having vascular procedure on dialysis graft; per family not healing all the way and still swollen; procedure done on Nov 17

## 2011-01-29 NOTE — Discharge Instructions (Signed)
 There is no appearance for postoperative infection. There is no active bleeding at this time. I've spoken to Dr. Gerlean regarding your concerns. I have scheduled you for a make up dialysis session tomorrow at 12 noon at her usual site. I recommend that you speak to Dr. Russ office on Monday morning and explain that you were seen here in the emergency department and that I had spoken with Dr. Gerlean regarding your graft site, and that I recommend that Dr. Serene reevaluate that area.

## 2011-02-09 ENCOUNTER — Encounter: Payer: Self-pay | Admitting: Surgery

## 2011-02-11 ENCOUNTER — Other Ambulatory Visit (HOSPITAL_COMMUNITY): Payer: Self-pay | Admitting: Nephrology

## 2011-02-11 DIAGNOSIS — N186 End stage renal disease: Secondary | ICD-10-CM

## 2011-02-12 ENCOUNTER — Encounter: Payer: Self-pay | Admitting: Surgery

## 2011-02-15 ENCOUNTER — Encounter: Payer: Self-pay | Admitting: Surgery

## 2011-02-15 ENCOUNTER — Ambulatory Visit (INDEPENDENT_AMBULATORY_CARE_PROVIDER_SITE_OTHER): Payer: Medicare Other | Admitting: Surgery

## 2011-02-15 VITALS — BP 79/55 | HR 114 | Resp 16 | Ht 65.0 in | Wt 110.0 lb

## 2011-02-15 DIAGNOSIS — N186 End stage renal disease: Secondary | ICD-10-CM | POA: Insufficient documentation

## 2011-02-15 NOTE — Progress Notes (Signed)
The patient is back today for followup. She presented to the emergency department on November 17 with bleeding from her left upper arm dialysis graft there was a defect over the graft and therefore I ended up revising this area with interposition graft. She has done well. Her wound has completely healed there is a good thrill within the graft I think it graft can be accessed at this time. She is scheduled for interventional radiology to study her graft I within the next week

## 2011-02-25 ENCOUNTER — Other Ambulatory Visit (HOSPITAL_COMMUNITY): Payer: Self-pay | Admitting: Nephrology

## 2011-02-25 ENCOUNTER — Ambulatory Visit (HOSPITAL_COMMUNITY)
Admission: RE | Admit: 2011-02-25 | Discharge: 2011-02-25 | Disposition: A | Discharge status: Home / Self Care | Payer: Medicare Other | Source: Ambulatory Visit | Attending: Nephrology | Admitting: Nephrology

## 2011-02-25 DIAGNOSIS — T82898A Other specified complication of vascular prosthetic devices, implants and grafts, initial encounter: Secondary | ICD-10-CM | POA: Insufficient documentation

## 2011-02-25 DIAGNOSIS — I120 Hypertensive chronic kidney disease with stage 5 chronic kidney disease or end stage renal disease: Secondary | ICD-10-CM | POA: Insufficient documentation

## 2011-02-25 DIAGNOSIS — N186 End stage renal disease: Secondary | ICD-10-CM | POA: Insufficient documentation

## 2011-02-25 DIAGNOSIS — I871 Compression of vein: Secondary | ICD-10-CM | POA: Insufficient documentation

## 2011-02-25 DIAGNOSIS — J449 Chronic obstructive pulmonary disease, unspecified: Secondary | ICD-10-CM | POA: Insufficient documentation

## 2011-02-25 DIAGNOSIS — Y832 Surgical operation with anastomosis, bypass or graft as the cause of abnormal reaction of the patient, or of later complication, without mention of misadventure at the time of the procedure: Secondary | ICD-10-CM | POA: Insufficient documentation

## 2011-02-25 DIAGNOSIS — J4489 Other specified chronic obstructive pulmonary disease: Secondary | ICD-10-CM | POA: Insufficient documentation

## 2011-02-25 DIAGNOSIS — Z992 Dependence on renal dialysis: Secondary | ICD-10-CM | POA: Insufficient documentation

## 2011-02-25 MED ORDER — IOHEXOL 300 MG/ML  SOLN
100.0000 mL | Freq: Once | INTRAMUSCULAR | Status: AC | PRN
  Administered 2011-02-25: 50 mL via INTRAVENOUS

## 2011-02-25 NOTE — H&P (Signed)
[name] is an 77 y.o. female.   Chief Complaint: Dialysis graft declot HPI: 77 yo female with history of ESRD. Had shuntogram today that revealed stenosis. Plan is for declot.  Past Medical History  Diagnosis Date  . Anemia   . Cystoid macular edema   . Dialysis patient   . Cataract     h/o  . Glaucoma   . Hypertension   . Renal insufficiency   . COPD (chronic obstructive pulmonary disease)     Past Surgical History  Procedure Date  . Abdominal hysterectomy   . Appendectomy   . Cataract extraction   . Arteriovenous graft placement   . Thrombectomy w/ embolectomy 01/16/2011    Procedure: THROMBECTOMY ARTERIOVENOUS GORE-TEX GRAFT;  Surgeon: Juleen China, MD;  Location: [facility] OR;  Service: Vascular;  Laterality: Left;  repair of left arm graft bleed and revision of left arm graft.    No family history on file. Social History:  reports that she has never smoked. She does not have any smokeless tobacco history on file. She reports that she does not drink alcohol or use illicit drugs.  Allergies: No Known Allergies  Medications Prior to Admission  Medication Sig Dispense Refill  . albuterol (PROVENTIL HFA;VENTOLIN HFA) 108 (90 BASE) MCG/ACT inhaler Inhale into the lungs every 6 (six) hours as needed. For shortness of breath.       Marland Kitchen b complex-vitamin c-folic acid (NEPHRO-VITE) 0.8 MG TABS Take 0.8 mg by mouth daily.       Marland Kitchen dipyridamole-aspirin (AGGRENOX) 25-200 MG per 12 hr capsule Take 1 capsule by mouth daily.       . dorzolamide (TRUSOPT) 2 % ophthalmic solution Place 1 drop into both eyes 2 (two) times daily.        . famotidine (PEPCID) 20 MG tablet Take 20 mg by mouth at bedtime.        Marland Kitchen latanoprost (XALATAN) 0.005 % ophthalmic solution Place 1 drop into both eyes at bedtime.       . megestrol (MEGACE) 40 MG/ML suspension Take 400 mg by mouth daily.       . midodrine (PROAMATINE) 10 MG tablet Take 10 mg by mouth 3 (three) times a week. Takes 1 tablet prior to  each HD treatments on Monday Wednesday and friday      . phosphorus (K PHOS NEUTRAL) 155-852-130 MG tablet Take 1 tablet by mouth 2 (two) times daily.        . protein supplement (RESOURCE BENEPROTEIN) 6 g POWD Take 1 scoop by mouth 2 (two) times daily with a meal.       . timolol (BETIMOL) 0.5 % ophthalmic solution Place 1 drop into both eyes 2 (two) times daily.         No current facility-administered medications on file as of 02/25/2011.    No results found for this or any previous visit (from the past 48 hour(s)). No results found.  Review of Systems  Constitutional: Negative for fever and chills.  Respiratory: Negative for shortness of breath.   Cardiovascular: Negative for chest pain.  Endo/Heme/Allergies: Does not bruise/bleed easily.    There were no vitals taken for this visit. Physical Exam  Heent - unremarkable except poor eyesight Heart - RRR Lungs - clear Abd - soft NT  Assessment/Plan Dialysis graft declot Dr Grace Isaac. Informed consent obtained.  [name] 02/25/2011, 3:06 PM

## 2011-02-25 NOTE — Procedures (Signed)
Technically successful fistulogram with angioplasty.  No immediate complications.   

## 2011-04-09 ENCOUNTER — Other Ambulatory Visit (HOSPITAL_COMMUNITY): Payer: Self-pay | Admitting: Nephrology

## 2011-04-09 DIAGNOSIS — N186 End stage renal disease: Secondary | ICD-10-CM

## 2011-04-21 ENCOUNTER — Encounter (HOSPITAL_COMMUNITY): Payer: Self-pay | Admitting: Pharmacy Technician

## 2011-04-22 ENCOUNTER — Encounter (HOSPITAL_COMMUNITY): Payer: Self-pay

## 2011-04-22 ENCOUNTER — Other Ambulatory Visit (HOSPITAL_COMMUNITY): Payer: Self-pay | Admitting: Nephrology

## 2011-04-22 ENCOUNTER — Telehealth (HOSPITAL_COMMUNITY): Payer: Self-pay | Admitting: Interventional Radiology

## 2011-04-22 ENCOUNTER — Ambulatory Visit (HOSPITAL_COMMUNITY)
Admission: RE | Admit: 2011-04-22 | Discharge: 2011-04-22 | Disposition: A | Discharge status: Home / Self Care | Payer: Medicare Other | Source: Ambulatory Visit | Attending: Nephrology | Admitting: Nephrology

## 2011-04-22 DIAGNOSIS — Y832 Surgical operation with anastomosis, bypass or graft as the cause of abnormal reaction of the patient, or of later complication, without mention of misadventure at the time of the procedure: Secondary | ICD-10-CM | POA: Insufficient documentation

## 2011-04-22 DIAGNOSIS — D649 Anemia, unspecified: Secondary | ICD-10-CM | POA: Insufficient documentation

## 2011-04-22 DIAGNOSIS — Z992 Dependence on renal dialysis: Secondary | ICD-10-CM | POA: Insufficient documentation

## 2011-04-22 DIAGNOSIS — H35359 Cystoid macular degeneration, unspecified eye: Secondary | ICD-10-CM | POA: Insufficient documentation

## 2011-04-22 DIAGNOSIS — T82898A Other specified complication of vascular prosthetic devices, implants and grafts, initial encounter: Secondary | ICD-10-CM | POA: Insufficient documentation

## 2011-04-22 DIAGNOSIS — J449 Chronic obstructive pulmonary disease, unspecified: Secondary | ICD-10-CM | POA: Insufficient documentation

## 2011-04-22 DIAGNOSIS — I120 Hypertensive chronic kidney disease with stage 5 chronic kidney disease or end stage renal disease: Secondary | ICD-10-CM | POA: Insufficient documentation

## 2011-04-22 DIAGNOSIS — H409 Unspecified glaucoma: Secondary | ICD-10-CM | POA: Insufficient documentation

## 2011-04-22 DIAGNOSIS — I871 Compression of vein: Secondary | ICD-10-CM | POA: Insufficient documentation

## 2011-04-22 DIAGNOSIS — N186 End stage renal disease: Secondary | ICD-10-CM

## 2011-04-22 DIAGNOSIS — J4489 Other specified chronic obstructive pulmonary disease: Secondary | ICD-10-CM | POA: Insufficient documentation

## 2011-04-22 MED ORDER — IOHEXOL 300 MG/ML  SOLN
100.0000 mL | Freq: Once | INTRAMUSCULAR | Status: AC | PRN
  Administered 2011-04-22: 30 mL via INTRAVENOUS

## 2011-04-22 NOTE — Procedures (Signed)
Shuntogram, Venous PTA 8mm No complication No blood loss. See complete dictation in [facility].

## 2011-04-22 NOTE — H&P (Signed)
[name] is an 78 y.o. female.   Chief Complaint: Left arm graft stenosis per shuntogram HPI: ESRD; scheduled for angioplasty/stent  Past Medical History  Diagnosis Date  . Anemia   . Cystoid macular edema   . Dialysis patient   . Cataract     h/o  . Glaucoma   . Hypertension   . Renal insufficiency   . COPD (chronic obstructive pulmonary disease)     Past Surgical History  Procedure Date  . Abdominal hysterectomy   . Appendectomy   . Cataract extraction   . Arteriovenous graft placement   . Thrombectomy w/ embolectomy 01/16/2011    Procedure: THROMBECTOMY ARTERIOVENOUS GORE-TEX GRAFT;  Surgeon: Juleen China, MD;  Location: [facility] OR;  Service: Vascular;  Laterality: Left;  repair of left arm graft bleed and revision of left arm graft.    No family history on file. Social History:  reports that she has never smoked. She does not have any smokeless tobacco history on file. She reports that she does not drink alcohol or use illicit drugs.  Allergies: No Known Allergies  Medications Prior to Admission  Medication Sig Dispense Refill  . albuterol (PROVENTIL HFA;VENTOLIN HFA) 108 (90 BASE) MCG/ACT inhaler Inhale into the lungs every 6 (six) hours as needed. For shortness of breath.       Marland Kitchen b complex-vitamin c-folic acid (NEPHRO-VITE) 0.8 MG TABS Take 0.8 mg by mouth daily.       Marland Kitchen dipyridamole-aspirin (AGGRENOX) 25-200 MG per 12 hr capsule Take 1 capsule by mouth daily.       . dorzolamide (TRUSOPT) 2 % ophthalmic solution Place 1 drop into both eyes 2 (two) times daily.        . famotidine (PEPCID) 20 MG tablet Take 20 mg by mouth at bedtime.        Marland Kitchen latanoprost (XALATAN) 0.005 % ophthalmic solution Place 1 drop into both eyes at bedtime.       . megestrol (MEGACE) 40 MG/ML suspension Take 400 mg by mouth daily.       . midodrine (PROAMATINE) 10 MG tablet Take 10 mg by mouth 3 (three) times a week. Takes 1 tablet prior to each HD treatments on Monday Wednesday and  friday      . phosphorus (K PHOS NEUTRAL) 155-852-130 MG tablet Take 1 tablet by mouth 2 (two) times daily.        . protein supplement (RESOURCE BENEPROTEIN) 6 g POWD Take 1 scoop by mouth 2 (two) times daily with a meal.       . timolol (BETIMOL) 0.5 % ophthalmic solution Place 1 drop into both eyes 2 (two) times daily.         No current facility-administered medications on file as of 04/22/2011.    No results found for this or any previous visit (from the past 48 hour(s)). No results found.  Review of Systems  Constitutional: Negative for fever.  Cardiovascular: Negative for chest pain.  Neurological: Negative for headaches.    There were no vitals taken for this visit. Physical Exam  Constitutional: She is oriented to person, place, and time. She appears well-developed.  Cardiovascular: Normal rate and normal heart sounds.   No murmur heard. Respiratory: She has no wheezes.  GI: Soft. There is no tenderness.  Neurological: She is alert and oriented to person, place, and time.  Skin: Skin is warm.     Assessment/Plan ESRD: left arm graft stenosis Scheduled for pta/stent Pt and son aware  of procedure benefits and risks and agreeable to proceed. Consent signed.  Dhanya Bogle A 04/22/2011, 1:48 PM

## 2011-04-23 ENCOUNTER — Telehealth (HOSPITAL_COMMUNITY): Payer: Self-pay | Admitting: *Deleted

## 2011-04-23 NOTE — Telephone Encounter (Signed)
Post procedure follow up call, daughter says she is doing well no problems at this time.

## 2011-06-10 ENCOUNTER — Other Ambulatory Visit: Payer: Self-pay | Admitting: Family Medicine

## 2011-06-10 ENCOUNTER — Ambulatory Visit
Admission: RE | Admit: 2011-06-10 | Discharge: 2011-06-10 | Disposition: A | Discharge status: Home / Self Care | Payer: Medicare Other | Source: Ambulatory Visit | Attending: Family Medicine | Admitting: Family Medicine

## 2011-06-10 DIAGNOSIS — R05 Cough: Secondary | ICD-10-CM

## 2011-06-10 DIAGNOSIS — R0989 Other specified symptoms and signs involving the circulatory and respiratory systems: Secondary | ICD-10-CM

## 2011-07-21 ENCOUNTER — Other Ambulatory Visit (HOSPITAL_COMMUNITY): Payer: Self-pay | Admitting: Nephrology

## 2011-07-21 DIAGNOSIS — N186 End stage renal disease: Secondary | ICD-10-CM

## 2011-07-27 ENCOUNTER — Ambulatory Visit (HOSPITAL_COMMUNITY): Admission: RE | Admit: 2011-07-27 | Payer: Medicare Other | Source: Ambulatory Visit

## 2011-07-29 ENCOUNTER — Ambulatory Visit (HOSPITAL_COMMUNITY)
Admission: RE | Admit: 2011-07-29 | Discharge: 2011-07-29 | Disposition: A | Discharge status: Home / Self Care | Payer: Medicare Other | Source: Ambulatory Visit | Attending: Nephrology | Admitting: Nephrology

## 2011-07-29 ENCOUNTER — Other Ambulatory Visit (HOSPITAL_COMMUNITY): Payer: Self-pay | Admitting: Nephrology

## 2011-07-29 ENCOUNTER — Encounter (HOSPITAL_COMMUNITY): Payer: Self-pay

## 2011-07-29 VITALS — HR 90 | Resp 10

## 2011-07-29 DIAGNOSIS — Z992 Dependence on renal dialysis: Secondary | ICD-10-CM | POA: Insufficient documentation

## 2011-07-29 DIAGNOSIS — N186 End stage renal disease: Secondary | ICD-10-CM | POA: Insufficient documentation

## 2011-07-29 DIAGNOSIS — J4489 Other specified chronic obstructive pulmonary disease: Secondary | ICD-10-CM | POA: Insufficient documentation

## 2011-07-29 DIAGNOSIS — T82898A Other specified complication of vascular prosthetic devices, implants and grafts, initial encounter: Secondary | ICD-10-CM | POA: Insufficient documentation

## 2011-07-29 DIAGNOSIS — Y849 Medical procedure, unspecified as the cause of abnormal reaction of the patient, or of later complication, without mention of misadventure at the time of the procedure: Secondary | ICD-10-CM | POA: Insufficient documentation

## 2011-07-29 DIAGNOSIS — J449 Chronic obstructive pulmonary disease, unspecified: Secondary | ICD-10-CM | POA: Insufficient documentation

## 2011-07-29 DIAGNOSIS — I120 Hypertensive chronic kidney disease with stage 5 chronic kidney disease or end stage renal disease: Secondary | ICD-10-CM | POA: Insufficient documentation

## 2011-07-29 MED ORDER — IOHEXOL 300 MG/ML  SOLN
100.0000 mL | Freq: Once | INTRAMUSCULAR | Status: AC | PRN
  Administered 2011-07-29: 40 mL via INTRAVENOUS

## 2011-07-29 NOTE — H&P (Signed)
[name] is an 78 y.o. female.   Chief Complaint: slow flows at dialysis left arm fistula Last intervention 04/09/11: PTA- Dr Deanne Coffer; amenable to further intervention if needed shuntogram today shows stenosis Scheduled now for angioplasty/stent HPI: ESRD; HTN; glaucoma; dementia  Past Medical History  Diagnosis Date  . Anemia   . Cystoid macular edema   . Dialysis patient   . Cataract     h/o  . Glaucoma   . Hypertension   . Renal insufficiency   . COPD (chronic obstructive pulmonary disease)     Past Surgical History  Procedure Date  . Abdominal hysterectomy   . Appendectomy   . Cataract extraction   . Arteriovenous graft placement   . Thrombectomy w/ embolectomy 01/16/2011    Procedure: THROMBECTOMY ARTERIOVENOUS GORE-TEX GRAFT;  Surgeon: Juleen China, MD;  Location: [facility] OR;  Service: Vascular;  Laterality: Left;  repair of left arm graft bleed and revision of left arm graft.    No family history on file. Social History:  reports that she has never smoked. She does not have any smokeless tobacco history on file. She reports that she does not drink alcohol or use illicit drugs.  Allergies: No Known Allergies   (Not in a hospital admission)  No results found for this or any previous visit (from the past 48 hour(s)). No results found.  Review of Systems  Constitutional: Negative for fever.  Cardiovascular: Negative for chest pain.  Gastrointestinal: Negative for nausea, vomiting and abdominal pain.    There were no vitals taken for this visit. Physical Exam  Constitutional: She appears well-developed.  Cardiovascular: Normal rate, regular rhythm and normal heart sounds.   No murmur heard. Respiratory: Effort normal and breath sounds normal. She has no wheezes.  GI: Soft. Bowel sounds are normal. There is no tenderness.  Musculoskeletal: Normal range of motion.       Uses walker/wc  Neurological: She is alert.  Skin: Skin is warm.  Psychiatric: She  has a normal mood and affect. Her behavior is normal.       Confusion/dementia     Assessment/Plan Left arm dialysis fistula stenosed per shuntogram today Scheduled now for angioplasty/stent now pts son in law aware of procedure benefits and risks and agreeable to proceed. Consent via phone; in chart  [name] A 07/29/2011, 11:48 AM

## 2011-07-29 NOTE — Procedures (Signed)
Shuntogram, venous PTA No complication No blood loss. See complete dictation in [facility] PACS.  

## 2011-07-30 ENCOUNTER — Telehealth (HOSPITAL_COMMUNITY): Payer: Self-pay | Admitting: *Deleted

## 2011-07-30 NOTE — Telephone Encounter (Signed)
Post procedure follow up call.  Spoke with pt's son in Social worker.  Says she's doing well with no problems a this time

## 2011-09-06 ENCOUNTER — Other Ambulatory Visit (HOSPITAL_COMMUNITY): Payer: Self-pay | Admitting: Interventional Radiology

## 2011-09-06 DIAGNOSIS — IMO0002 Reserved for concepts with insufficient information to code with codable children: Secondary | ICD-10-CM

## 2011-09-09 ENCOUNTER — Ambulatory Visit (HOSPITAL_COMMUNITY)
Admission: RE | Admit: 2011-09-09 | Discharge: 2011-09-09 | Disposition: A | Discharge status: Home / Self Care | Payer: Medicare Other | Source: Ambulatory Visit | Attending: Interventional Radiology | Admitting: Interventional Radiology

## 2011-09-09 DIAGNOSIS — M8448XA Pathological fracture, other site, initial encounter for fracture: Secondary | ICD-10-CM | POA: Insufficient documentation

## 2011-09-09 DIAGNOSIS — M549 Dorsalgia, unspecified: Secondary | ICD-10-CM | POA: Insufficient documentation

## 2011-09-09 DIAGNOSIS — IMO0002 Reserved for concepts with insufficient information to code with codable children: Secondary | ICD-10-CM

## 2011-09-10 ENCOUNTER — Other Ambulatory Visit: Payer: Self-pay | Admitting: Physician Assistant

## 2011-09-10 ENCOUNTER — Other Ambulatory Visit (HOSPITAL_COMMUNITY): Payer: Self-pay | Admitting: Internal Medicine

## 2011-09-10 ENCOUNTER — Other Ambulatory Visit (HOSPITAL_COMMUNITY): Payer: Self-pay | Admitting: Interventional Radiology

## 2011-09-10 DIAGNOSIS — IMO0002 Reserved for concepts with insufficient information to code with codable children: Secondary | ICD-10-CM

## 2011-09-14 ENCOUNTER — Ambulatory Visit (HOSPITAL_COMMUNITY)
Admission: RE | Admit: 2011-09-14 | Discharge: 2011-09-14 | Disposition: A | Discharge status: Home / Self Care | Payer: Medicare Other | Source: Ambulatory Visit | Attending: Interventional Radiology | Admitting: Interventional Radiology

## 2011-09-14 ENCOUNTER — Encounter (HOSPITAL_COMMUNITY): Payer: Self-pay

## 2011-09-14 ENCOUNTER — Other Ambulatory Visit (HOSPITAL_COMMUNITY): Payer: Self-pay | Admitting: Interventional Radiology

## 2011-09-14 DIAGNOSIS — J4489 Other specified chronic obstructive pulmonary disease: Secondary | ICD-10-CM | POA: Insufficient documentation

## 2011-09-14 DIAGNOSIS — IMO0002 Reserved for concepts with insufficient information to code with codable children: Secondary | ICD-10-CM

## 2011-09-14 DIAGNOSIS — Z992 Dependence on renal dialysis: Secondary | ICD-10-CM | POA: Insufficient documentation

## 2011-09-14 DIAGNOSIS — M8448XA Pathological fracture, other site, initial encounter for fracture: Secondary | ICD-10-CM | POA: Insufficient documentation

## 2011-09-14 DIAGNOSIS — J449 Chronic obstructive pulmonary disease, unspecified: Secondary | ICD-10-CM | POA: Insufficient documentation

## 2011-09-14 DIAGNOSIS — N289 Disorder of kidney and ureter, unspecified: Secondary | ICD-10-CM | POA: Insufficient documentation

## 2011-09-14 DIAGNOSIS — I10 Essential (primary) hypertension: Secondary | ICD-10-CM | POA: Insufficient documentation

## 2011-09-14 LAB — BASIC METABOLIC PANEL
BUN: 17 mg/dL (ref 6–23)
CO2: 33 mEq/L — ABNORMAL HIGH (ref 19–32)
Chloride: 98 mEq/L (ref 96–112)
Creatinine, Ser: 3.96 mg/dL — ABNORMAL HIGH (ref 0.50–1.10)

## 2011-09-14 LAB — CBC
HCT: 38.8 % (ref 36.0–46.0)
Hemoglobin: 12.4 g/dL (ref 12.0–15.0)
MCV: 101 fL — ABNORMAL HIGH (ref 78.0–100.0)
RDW: 14.5 % (ref 11.5–15.5)
WBC: 14.4 10*3/uL — ABNORMAL HIGH (ref 4.0–10.5)

## 2011-09-14 LAB — APTT: aPTT: 28 seconds (ref 24–37)

## 2011-09-14 MED ORDER — IOHEXOL 300 MG/ML  SOLN
50.0000 mL | Freq: Once | INTRAMUSCULAR | Status: AC | PRN
  Administered 2011-09-14: 5 mL

## 2011-09-14 MED ORDER — TOBRAMYCIN SULFATE 1.2 G IJ SOLR
INTRAMUSCULAR | Status: AC
  Filled 2011-09-14: qty 1.2

## 2011-09-14 MED ORDER — CEFAZOLIN SODIUM 1-5 GM-% IV SOLN
1.0000 g | Freq: Once | INTRAVENOUS | Status: AC
  Administered 2011-09-14: 1 g via INTRAVENOUS

## 2011-09-14 MED ORDER — SODIUM CHLORIDE 0.9 % IV SOLN: INTRAVENOUS | Status: DC

## 2011-09-14 MED ORDER — MIDAZOLAM HCL 2 MG/2ML IJ SOLN
INTRAMUSCULAR | Status: AC
  Filled 2011-09-14: qty 6

## 2011-09-14 MED ORDER — FENTANYL CITRATE 0.05 MG/ML IJ SOLN
INTRAMUSCULAR | Status: DC | PRN
  Administered 2011-09-14 (×2): 12.5 ug via INTRAVENOUS

## 2011-09-14 MED ORDER — HYDROMORPHONE HCL PF 1 MG/ML IJ SOLN
INTRAMUSCULAR | Status: AC
  Filled 2011-09-14: qty 3

## 2011-09-14 MED ORDER — GELATIN ABSORBABLE 12-7 MM EX MISC
CUTANEOUS | Status: AC
  Filled 2011-09-14: qty 1

## 2011-09-14 MED ORDER — SODIUM CHLORIDE 0.9 % IV SOLN
INTRAVENOUS | Status: DC
  Administered 2011-09-14: 1000 mL via INTRAVENOUS

## 2011-09-14 MED ORDER — FENTANYL CITRATE 0.05 MG/ML IJ SOLN
INTRAMUSCULAR | Status: AC
  Filled 2011-09-14: qty 4

## 2011-09-14 MED ORDER — MIDAZOLAM HCL 5 MG/5ML IJ SOLN
INTRAMUSCULAR | Status: DC | PRN
  Administered 2011-09-14 (×2): 0.5 mg via INTRAVENOUS

## 2011-09-14 NOTE — H&P (Signed)
Chief Complaint: Back pain Referring Physician: HPI: [name] is an 78 y.o. female who is found to have compression fractures of her low back at L5 and S2. This was confirmed on MRI and she is scheduled for treatment today. PMHx reviewed. She denies acute illness or c/o today. No recent fevers, cough, SOB, abd pain, dysuria.  Past Medical History:  Past Medical History  Diagnosis Date  . Anemia   . Cystoid macular edema   . Dialysis patient   . Cataract     h/o  . Glaucoma   . Hypertension   . Renal insufficiency   . COPD (chronic obstructive pulmonary disease)     Past Surgical History:  Past Surgical History  Procedure Date  . Abdominal hysterectomy   . Appendectomy   . Cataract extraction   . Arteriovenous graft placement   . Thrombectomy w/ embolectomy 01/16/2011    Procedure: THROMBECTOMY ARTERIOVENOUS GORE-TEX GRAFT;  Surgeon: Juleen China, MD;  Location: [facility] OR;  Service: Vascular;  Laterality: Left;  repair of left arm graft bleed and revision of left arm graft.    Family History: No family history on file.  Social History:  reports that she has never smoked. She does not have any smokeless tobacco history on file. She reports that she does not drink alcohol or use illicit drugs.  Allergies: No Known Allergies  Medications: albuterol (PROVENTIL HFA;VENTOLIN HFA) 108 (90 BASE) MCG/ACT inhaler (Taking) Sig - Route: Inhale into the lungs every 6 (six) hours as needed. For shortness of breath. - Inhalation Class: Historical Med Number of times this order has been changed since signing: 1 Order [address] b complex-vitamin c-folic acid (NEPHRO-VITE) 0.8 MG TABS (Taking) Sig - Route: Take 0.8 mg by mouth daily. - Oral Class: Historical Med Number of times this order has been changed since signing: 4 Order [address] dipyridamole-aspirin (AGGRENOX) 25-200 MG per 12 hr capsule (Taking) Sig - Route: Take 1 capsule by mouth daily. - Oral Class: Historical Med Number  of times this order has been changed since signing: 3 Order [address] dorzolamide (TRUSOPT) 2 % ophthalmic solution (Taking) Sig - Route: Place 1 drop into both eyes 2 (two) times daily. - Both Eyes Class: Historical Med Number of times this order has been changed since signing: 4 Order [address] famotidine (PEPCID) 20 MG tablet (Taking) Sig - Route: Take 20 mg by mouth at bedtime. - Oral Class: Historical Med Number of times this order has been changed since signing: 2 Order [address] latanoprost (XALATAN) 0.005 % ophthalmic solution (Taking) Sig - Route: Place 1 drop into both eyes at bedtime. - Both Eyes Class: Historical Med Number of times this order has been changed since signing: 3 Order [address] megestrol (MEGACE) 40 MG/ML suspension (Taking) Sig - Route: Take 400 mg by mouth 2 (two) times daily. - Oral Class: Historical Med Number of times this order has been changed since signing: 5 Order [address] midodrine (PROAMATINE) 10 MG tablet (Taking) Sig - Route: Take 15 mg by mouth 3 (three) times a week. Takes 1 tablet prior to each HD treatments on Monday Wednesday and friday - Oral Class: Historical Med Number of times this order has been changed since signing: 4 Order [address] phosphorus (K PHOS NEUTRAL) 155-852-130 MG tablet (Taking) Sig - Route: Take 1 tablet by mouth daily. - Oral Class: Historical Med Number of times this order has been changed since signing: 4 Order [address] protein supplement (RESOURCE  BENEPROTEIN) 6 g POWD (Taking) Sig - Route: Take 1 scoop by mouth 2 (two) times daily with a meal. - Oral Class: Historical Med Number of times this order has been changed since signing: 4 Order [address] timolol (BETIMOL) 0.5 % ophthalmic solution (Taking) Sig - Route: Place 1 drop into both eyes 2 (two) times daily. - Both Eyes Class: Historical Med   Please HPI for pertinent positives, otherwise complete 10 system ROS negative.  Physical Exam: Blood pressure 112/70, pulse 78,  temperature 98.1 F (36.7 C), temperature source Oral, resp. rate 18, height 5\' 1"  (1.549 m), weight 105 lb (47.628 kg), SpO2 99.00%. Body mass index is 19.84 kg/(m^2).   General Appearance:  Alert, cooperative, no distress, appears stated age  Head:  Normocephalic, without obvious abnormality, atraumatic  ENT: Unremarkable  Neck: Supple, symmetrical, trachea midline, no adenopathy, thyroid: not enlarged, symmetric, no tenderness/mass/nodules  Lungs:   Clear to auscultation bilaterally, no w/r/r, respirations unlabored without use of accessory muscles.  Chest Wall:  No tenderness or deformity  Heart:  Regular rate and rhythm, S1, S2 normal, no murmur, rub or gallop. Carotids 2+ without bruit.  Abdomen:   Soft, non-tender, non distended. Bowel sounds active all four quadrants,  no masses, no organomegaly.  Extremities: Extremities normal. (L)UE AVG looks good, no erythema or warmth.  Neurologic: Normal affect, no gross deficits.   Results for orders placed during the hospital encounter of 09/14/11 (from the past 48 hour(s))  APTT     Status: Normal   Collection Time   09/14/11  6:37 AM      Component Value Range Comment   aPTT 28  24 - 37 seconds   BASIC METABOLIC PANEL     Status: Abnormal   Collection Time   09/14/11  6:37 AM      Component Value Range Comment   Sodium 142  135 - 145 mEq/L    Potassium 3.8  3.5 - 5.1 mEq/L    Chloride 98  96 - 112 mEq/L    CO2 33 (*) 19 - 32 mEq/L    Glucose, Bld 102 (*) 70 - 99 mg/dL    BUN 17  6 - 23 mg/dL    Creatinine, Ser 4.78 (*) 0.50 - 1.10 mg/dL    Calcium 9.6  8.4 - 29.5 mg/dL    GFR calc non Af Amer 9 (*) >90 mL/min    GFR calc Af Amer 11 (*) >90 mL/min   CBC     Status: Abnormal   Collection Time   09/14/11  6:37 AM      Component Value Range Comment   WBC 14.4 (*) 4.0 - 10.5 K/uL    RBC 3.84 (*) 3.87 - 5.11 MIL/uL    Hemoglobin 12.4  12.0 - 15.0 g/dL    HCT 62.1  30.8 - 65.7 %    MCV 101.0 (*) 78.0 - 100.0 fL    MCH 32.3  26.0 -  34.0 pg    MCHC 32.0  30.0 - 36.0 g/dL    RDW 84.6  96.2 - 95.2 %    Platelets 340  150 - 400 K/uL   PROTIME-INR     Status: Normal   Collection Time   09/14/11  6:37 AM      Component Value Range Comment   Prothrombin Time 13.3  11.6 - 15.2 seconds    INR 0.99  0.00 - 1.49    No results found.  Assessment/Plan L5 and S2 compr fx  Needs sacro/vertebroplasty. Chronic elevated WBC of uncertain etiology. Have not found a normal WBC when reviewing pt labs over past 18 months. No evidence of acute infectious process. Discussed procedure including risks and complications. Other labs ok. Consent signed in chart  Brayton El PA-C 09/14/2011, 7:54 AM

## 2011-09-14 NOTE — Procedures (Signed)
S/P S1 VP - sacroplasty

## 2011-09-14 NOTE — ED Notes (Signed)
Report to [facility] short stay and to [facility]

## 2011-09-14 NOTE — ED Notes (Signed)
Drowsy but rouses to voice. Denies pain. In nurses station and report received from [facility].

## 2011-09-14 NOTE — ED Notes (Signed)
O2 2L [place] 

## 2011-09-15 ENCOUNTER — Telehealth (HOSPITAL_COMMUNITY): Payer: Self-pay | Admitting: *Deleted

## 2011-09-15 NOTE — Telephone Encounter (Signed)
Post procedure follow up call.  Spoke with pt's son, says no concerns or questions at this time

## 2011-09-21 ENCOUNTER — Other Ambulatory Visit (HOSPITAL_COMMUNITY): Payer: Self-pay | Admitting: Interventional Radiology

## 2011-09-21 DIAGNOSIS — IMO0002 Reserved for concepts with insufficient information to code with codable children: Secondary | ICD-10-CM

## 2011-09-28 ENCOUNTER — Emergency Department (HOSPITAL_COMMUNITY): Payer: Medicare Other

## 2011-09-28 ENCOUNTER — Ambulatory Visit (HOSPITAL_COMMUNITY): Payer: Medicare Other

## 2011-09-28 ENCOUNTER — Emergency Department (HOSPITAL_COMMUNITY)
Admission: EM | Admit: 2011-09-28 | Discharge: 2011-09-28 | Disposition: A | Discharge status: Home / Self Care | Payer: Medicare Other | Attending: Emergency Medicine | Admitting: Emergency Medicine

## 2011-09-28 ENCOUNTER — Telehealth (HOSPITAL_COMMUNITY): Payer: Self-pay

## 2011-09-28 ENCOUNTER — Encounter (HOSPITAL_COMMUNITY): Payer: Self-pay | Admitting: Emergency Medicine

## 2011-09-28 DIAGNOSIS — Z992 Dependence on renal dialysis: Secondary | ICD-10-CM | POA: Insufficient documentation

## 2011-09-28 DIAGNOSIS — Z9889 Other specified postprocedural states: Secondary | ICD-10-CM | POA: Insufficient documentation

## 2011-09-28 DIAGNOSIS — S32509A Unspecified fracture of unspecified pubis, initial encounter for closed fracture: Secondary | ICD-10-CM | POA: Insufficient documentation

## 2011-09-28 DIAGNOSIS — Z79899 Other long term (current) drug therapy: Secondary | ICD-10-CM | POA: Insufficient documentation

## 2011-09-28 DIAGNOSIS — I10 Essential (primary) hypertension: Secondary | ICD-10-CM | POA: Insufficient documentation

## 2011-09-28 DIAGNOSIS — X500XXA Overexertion from strenuous movement or load, initial encounter: Secondary | ICD-10-CM | POA: Insufficient documentation

## 2011-09-28 DIAGNOSIS — S32599A Other specified fracture of unspecified pubis, initial encounter for closed fracture: Secondary | ICD-10-CM

## 2011-09-28 DIAGNOSIS — Z9089 Acquired absence of other organs: Secondary | ICD-10-CM | POA: Insufficient documentation

## 2011-09-28 DIAGNOSIS — J449 Chronic obstructive pulmonary disease, unspecified: Secondary | ICD-10-CM | POA: Insufficient documentation

## 2011-09-28 DIAGNOSIS — J4489 Other specified chronic obstructive pulmonary disease: Secondary | ICD-10-CM | POA: Insufficient documentation

## 2011-09-28 DIAGNOSIS — M549 Dorsalgia, unspecified: Secondary | ICD-10-CM | POA: Insufficient documentation

## 2011-09-28 LAB — CBC WITH DIFFERENTIAL/PLATELET
Basophils Absolute: 0 10*3/uL (ref 0.0–0.1)
Basophils Relative: 0 % (ref 0–1)
Eosinophils Absolute: 0.2 10*3/uL (ref 0.0–0.7)
Lymphs Abs: 1.5 10*3/uL (ref 0.7–4.0)
MCH: 32.4 pg (ref 26.0–34.0)
MCHC: 32.5 g/dL (ref 30.0–36.0)
Neutrophils Relative %: 80 % — ABNORMAL HIGH (ref 43–77)
Platelets: 353 10*3/uL (ref 150–400)
RBC: 3.46 MIL/uL — ABNORMAL LOW (ref 3.87–5.11)
RDW: 14.6 % (ref 11.5–15.5)

## 2011-09-28 LAB — POCT I-STAT, CHEM 8
Chloride: 98 mEq/L (ref 96–112)
HCT: 35 % — ABNORMAL LOW (ref 36.0–46.0)
Hemoglobin: 11.9 g/dL — ABNORMAL LOW (ref 12.0–15.0)
Potassium: 3.1 mEq/L — ABNORMAL LOW (ref 3.5–5.1)
Sodium: 142 mEq/L (ref 135–145)

## 2011-09-28 LAB — BASIC METABOLIC PANEL
Calcium: 9.4 mg/dL (ref 8.4–10.5)
GFR calc Af Amer: 8 mL/min — ABNORMAL LOW (ref >90)
GFR calc non Af Amer: 7 mL/min — ABNORMAL LOW (ref >90)
Potassium: 3.2 mEq/L — ABNORMAL LOW (ref 3.5–5.1)
Sodium: 141 mEq/L (ref 135–145)

## 2011-09-28 MED ORDER — HYDROCODONE-ACETAMINOPHEN 5-325 MG PO TABS: 1.0000 | ORAL_TABLET | Freq: Once | ORAL | Status: DC

## 2011-09-28 MED ORDER — HYDROCODONE-ACETAMINOPHEN 5-325 MG PO TABS: 0.5000 | ORAL_TABLET | Freq: Once | ORAL | Status: DC

## 2011-09-28 MED ORDER — HYDROCODONE-ACETAMINOPHEN 5-500 MG PO TABS: 1.0000 | ORAL_TABLET | Freq: Four times a day (QID) | ORAL | Status: AC | PRN

## 2011-09-28 MED ORDER — ACETAMINOPHEN 500 MG PO TABS
1000.0000 mg | ORAL_TABLET | Freq: Once | ORAL | Status: AC
  Administered 2011-09-28: 1000 mg via ORAL
  Filled 2011-09-28: qty 2

## 2011-09-28 NOTE — ED Notes (Signed)
Patient transported to X-ray 

## 2011-09-28 NOTE — ED Provider Notes (Signed)
History     CSN: [id number]  Arrival date & time 09/28/11  1309   First MD Initiated Contact with Patient 09/28/11 1314      Chief Complaint  Patient presents with  . Back Pain    (Consider location/radiation/quality/duration/timing/severity/associated sxs/prior treatment) Patient is a 78 y.o. female presenting with back pain. The history is provided by a caregiver.  Back Pain  This is a recurrent problem. Episode onset: Worsening over the last 2 weeks after her vertebral plasty. The problem occurs constantly. The problem has been gradually worsening. The pain is associated with no known injury. The pain is present in the lumbar spine. The quality of the pain is described as stabbing (Pain is worse when lifting either of her legs). The pain does not radiate. The pain is at a severity of 9/10. The pain is severe. The symptoms are aggravated by bending and certain positions. The pain is the same all the time. Stiffness is present all day. Associated symptoms include pelvic pain and leg pain. Pertinent negatives include no fever, no abdominal pain, no abdominal swelling, no bowel incontinence, no perianal numbness and no paresthesias. Treatments tried: Tylenol. The treatment provided mild relief.    Past Medical History  Diagnosis Date  . Anemia   . Cystoid macular edema   . Dialysis patient   . Cataract     h/o  . Glaucoma   . Hypertension   . Renal insufficiency   . COPD (chronic obstructive pulmonary disease)     Past Surgical History  Procedure Date  . Abdominal hysterectomy   . Appendectomy   . Cataract extraction   . Arteriovenous graft placement   . Thrombectomy w/ embolectomy 01/16/2011    Procedure: THROMBECTOMY ARTERIOVENOUS GORE-TEX GRAFT;  Surgeon: Juleen China, MD;  Location: [facility] OR;  Service: Vascular;  Laterality: Left;  repair of left arm graft bleed and revision of left arm graft.    History reviewed. No pertinent family history.  History  Substance Use  Topics  . Smoking status: Never Smoker   . Smokeless tobacco: Not on file  . Alcohol Use: No    OB History    Grav Para Term Preterm Abortions TAB SAB Ect Mult Living                  Review of Systems  Constitutional: Negative for fever.  Gastrointestinal: Negative for abdominal pain and bowel incontinence.  Genitourinary: Positive for pelvic pain.  Musculoskeletal: Positive for back pain.  Neurological: Negative for paresthesias.  All other systems reviewed and are negative.    Allergies  Review of patient's allergies indicates no known allergies.  Home Medications   Current Outpatient Rx  Name Route Sig Dispense Refill  . ALBUTEROL SULFATE HFA 108 (90 BASE) MCG/ACT IN AERS Inhalation Inhale into the lungs every 6 (six) hours as needed. For shortness of breath.    . NEPHRO-VITE 0.8 MG PO TABS Oral Take 0.8 mg by mouth daily.     . ASPIRIN-DIPYRIDAMOLE ER 25-200 MG PO CP12 Oral Take 1 capsule by mouth daily.     . DORZOLAMIDE HCL 2 % OP SOLN Both Eyes Place 1 drop into both eyes 2 (two) times daily.     Marland Kitchen FAMOTIDINE 20 MG PO TABS Oral Take 20 mg by mouth at bedtime.     Marland Kitchen LATANOPROST 0.005 % OP SOLN Both Eyes Place 1 drop into both eyes at bedtime.     . MEGESTROL ACETATE 40 MG/ML PO SUSP  Oral Take 400 mg by mouth 2 (two) times daily.     Marland Kitchen MIDODRINE HCL 10 MG PO TABS Oral Take 15 mg by mouth 3 (three) times a week. Takes 1 tablet prior to each HD treatments on Monday Wednesday and friday    . RESOURCE INSTANT PROTEIN PO PWD PACKET Oral Take 1 scoop by mouth 2 (two) times daily with a meal.     . TIMOLOL HEMIHYDRATE 0.5 % OP SOLN Both Eyes Place 1 drop into both eyes 2 (two) times daily.       BP 113/62  Pulse 78  Temp 97.8 F (36.6 C) (Oral)  Resp 18  SpO2 98%  Physical Exam  Nursing note and vitals reviewed. Constitutional: She is oriented to person, place, and time. She appears well-developed and well-nourished. No distress.       Resting comfortably and told  her legs are moved and then she grimaces in pain  HENT:  Head: Normocephalic and atraumatic.  Mouth/Throat: Oropharynx is clear and moist.  Eyes: Conjunctivae and EOM are normal. Pupils are equal, round, and reactive to light.  Neck: Normal range of motion. Neck supple.  Cardiovascular: Normal rate, regular rhythm and intact distal pulses.   No murmur heard. Pulmonary/Chest: Effort normal and breath sounds normal. No respiratory distress. She has no wheezes. She has no rales.  Abdominal: Soft. She exhibits no distension. There is no tenderness. There is no rebound and no guarding.       Normal stool in ostomy. Normal appearing ostomy in the right lower quadrant  Musculoskeletal: Normal range of motion. She exhibits no edema and no tenderness.       Tenderness with palpation of the lower lumbar spine  Neurological: She is alert and oriented to person, place, and time.  Skin: Skin is warm and dry. No rash noted. No erythema.  Psychiatric: She has a normal mood and affect. Her behavior is normal.    ED Course  Procedures (including critical care time)  Labs Reviewed - No data to display Dg Lumbar Spine Complete  09/28/2011  *RADIOLOGY REPORT*  Clinical Data: Back pain.  LUMBAR SPINE - COMPLETE 4+ VIEW  Comparison: 09/09/2011  Findings: AP, lateral and oblique images of lumbar spine were obtained.  Patient has undergone vertebral augmentation procedures at T11, T12, L1, L2, L4, L5 and bilateral sacroplasty.  There is deformity and height loss involving the superior endplate of L3. The configuration of the L3 vertebral body has not significantly changed since the recent MRI but it difficult to exclude a subtle injury in this area. There is diffuse osteopenia.  IMPRESSION: Patient has undergone multiple levels of vertebral augmentation and sacroplasty.  There is deformity of the L3 superior endplate but this has a similar configuration from the recent MRI.  Difficult to exclude a subtle injury in  this area.  If this is the area of clinical concern, recommend further evaluation with MRI.  Original Report Authenticated By: Richarda Overlie, M.D.     No diagnosis found.    MDM   Patient with a history of vertebroplasty 2 weeks ago due to persistent lumbar compression fractures presents today based on the recommendation of the interventional radiologist for recheck. Family member states that she's had ongoing pain since her vertebroplasty that has not improved and may have worsened. She is not taking any additional medications for the pain other than intermittent Tylenol. She had a normal dialysis yesterday but family states that she has severe pain any time she  is moved or her legs are lifted. She has normal circulation and no abdominal pain with normal stools today. She wakes up easily with verbal stimulation. She does seem to be in a significant amount of pain any time her legs are moved. Will give Tylenol and get a lumbar films and speak with interventional radiology.  3:08 PM Plan number film was unchanged. After speaking with interventional radiology they recommended doing an MR of the L1 spine to evaluate for  fracture. Also after speaking with the grandson again he having a lot of trouble taking care of her at home due to her severe pain in getting her to dialysis. He is requesting that she be admitted for pain control and observation to see if something helps her and she is able to move around better. Patient went to the CDU to await blood testing and MRI      Gwyneth Sprout, MD 09/28/11 1510

## 2011-09-28 NOTE — ED Provider Notes (Signed)
Pt seen and examined by me in CDU. Pt is status post vertebroplasty 2 wks ago. Here with increased pain, unable to sit up, stand up, move her legs without pain. Currenlty pain 9/10. Pt's son has been having to pick her up and transfer her everywhere, now even unable to sit. Family also concerned, because unsure of what mediation to give her. Believe tylenol is making her hallucinate, but not sure. Pt in CDU waiting for labs and MRI per Dr. Erick Alley request.   Results for orders placed during the hospital encounter of 09/28/11  CBC WITH DIFFERENTIAL      Component Value Range   WBC 12.7 (*) 4.0 - 10.5 K/uL   RBC 3.46 (*) 3.87 - 5.11 MIL/uL   Hemoglobin 11.2 (*) 12.0 - 15.0 g/dL   HCT 16.1 (*) 09.6 - 04.5 %   MCV 99.7  78.0 - 100.0 fL   MCH 32.4  26.0 - 34.0 pg   MCHC 32.5  30.0 - 36.0 g/dL   RDW 40.9  81.1 - 91.4 %   Platelets 353  150 - 400 K/uL   Neutrophils Relative 80 (*) 43 - 77 %   Neutro Abs 10.1 (*) 1.7 - 7.7 K/uL   Lymphocytes Relative 12  12 - 46 %   Lymphs Abs 1.5  0.7 - 4.0 K/uL   Monocytes Relative 7  3 - 12 %   Monocytes Absolute 0.8  0.1 - 1.0 K/uL   Eosinophils Relative 2  0 - 5 %   Eosinophils Absolute 0.2  0.0 - 0.7 K/uL   Basophils Relative 0  0 - 1 %   Basophils Absolute 0.0  0.0 - 0.1 K/uL  BASIC METABOLIC PANEL      Component Value Range   Sodium 141  135 - 145 mEq/L   Potassium 3.2 (*) 3.5 - 5.1 mEq/L   Chloride 97  96 - 112 mEq/L   CO2 31  19 - 32 mEq/L   Glucose, Bld 127 (*) 70 - 99 mg/dL   BUN 23  6 - 23 mg/dL   Creatinine, Ser 7.82 (*) 0.50 - 1.10 mg/dL   Calcium 9.4  8.4 - 95.6 mg/dL   GFR calc non Af Amer 7 (*) >90 mL/min   GFR calc Af Amer 8 (*) >90 mL/min  POCT I-STAT, CHEM 8      Component Value Range   Sodium 142  135 - 145 mEq/L   Potassium 3.1 (*) 3.5 - 5.1 mEq/L   Chloride 98  96 - 112 mEq/L   BUN 23  6 - 23 mg/dL   Creatinine, Ser 2.13 (*) 0.50 - 1.10 mg/dL   Glucose, Bld 086 (*) 70 - 99 mg/dL   Calcium, Ion 5.78  4.69 - 1.30 mmol/L     TCO2 29  0 - 100 mmol/L   Hemoglobin 11.9 (*) 12.0 - 15.0 g/dL   HCT 62.9 (*) 52.8 - 41.3 %   Dg Lumbar Spine Complete  09/28/2011  *RADIOLOGY REPORT*  Clinical Data: Back pain.  LUMBAR SPINE - COMPLETE 4+ VIEW  Comparison: 09/09/2011  Findings: AP, lateral and oblique images of lumbar spine were obtained.  Patient has undergone vertebral augmentation procedures at T11, T12, L1, L2, L4, L5 and bilateral sacroplasty.  There is deformity and height loss involving the superior endplate of L3. The configuration of the L3 vertebral body has not significantly changed since the recent MRI but it difficult to exclude a subtle injury in this area. There is  diffuse osteopenia.  IMPRESSION: Patient has undergone multiple levels of vertebral augmentation and sacroplasty.  There is deformity of the L3 superior endplate but this has a similar configuration from the recent MRI.  Difficult to exclude a subtle injury in this area.  If this is the area of clinical concern, recommend further evaluation with MRI.  Original Report Authenticated By: Richarda Overlie, M.D.   Mr Lumbar Spine Wo Contrast  09/28/2011  *RADIOLOGY REPORT*  Clinical Data:  Back pain.  Rule out new fracture.  MRI LUMBAR SPINE WITHOUT CONTRAST  Technique:  Multiplanar and multiecho pulse sequences of the lumbar spine were obtained without intravenous contrast.  Comparison:  Lumbar MRI 09/09/2011  Findings:  Multiple chronic compression fractures are present in the lumbar spine involving T11-L5.  Cement vertebroplasty has been performed at all these levels except  L3.  No acute bone marrow edema to suggest acute fracture.  These fractures are unchanged from the prior MRI.  There is a mild nondisplaced fracture of the S2 vertebral body, unchanged from prior study.  Interval bilateral sacroplasty has been performed since the prior MRI.  Conus medullaris is normal.  No cord compression.  Disc degeneration and facet degeneration at L3-4 and L4-5 with mild spinal  stenosis.  Atrophic kidneys with multiple cysts compatible with chronic renal failure.  IMPRESSION: Chronic fractures of T11-L5 are similar to the prior MRI.  Fracture of S2 is unchanged from the  prior study.  No acute fracture compared with the prior study.  *RADIOLOGY REPORT*  Clinical Data:  Back pain.  Rule out new fracture.  MRI SACRUM WITHOUT CONTRAST  Technique:  Multiplanar and multiecho pulse sequences of the sacrum to include the sacroiliac joints and the coccyx were obtained without intravenous contrast.  Comparison:  MRI 09/28/2011  Findings:  Interval sacroplasty bilaterally with cement in the sacrum surrounded by edema.  This was not present previously. There is a nondisplaced fracture of the S2 segment which was present previously.  SI joints are intact.  No new fracture.  Image quality degraded by motion.  IMPRESSION:  Fracture of S2 is unchanged.  Interval bilateral sacroplasty with cement compared with the prior study.  No new fracture.  Original Report Authenticated By: Camelia Phenes, M.D.    Mr Sacrum/si Joints Wo Contrast  09/28/2011  *RADIOLOGY REPORT*  Clinical Data:  Back pain.  Rule out new fracture.  MRI LUMBAR SPINE WITHOUT CONTRAST  Technique:  Multiplanar and multiecho pulse sequences of the lumbar spine were obtained without intravenous contrast.  Comparison:  Lumbar MRI 09/09/2011  Findings:  Multiple chronic compression fractures are present in the lumbar spine involving T11-L5.  Cement vertebroplasty has been performed at all these levels except  L3.  No acute bone marrow edema to suggest acute fracture.  These fractures are unchanged from the prior MRI.  There is a mild nondisplaced fracture of the S2 vertebral body, unchanged from prior study.  Interval bilateral sacroplasty has been performed since the prior MRI.  Conus medullaris is normal.  No cord compression.  Disc degeneration and facet degeneration at L3-4 and L4-5 with mild spinal stenosis.  Atrophic kidneys with  multiple cysts compatible with chronic renal failure.  IMPRESSION: Chronic fractures of T11-L5 are similar to the prior MRI.  Fracture of S2 is unchanged from the  prior study.  No acute fracture compared with the prior study.  *RADIOLOGY REPORT*  Clinical Data:  Back pain.  Rule out new fracture.  MRI SACRUM WITHOUT CONTRAST  Technique:  Multiplanar and multiecho pulse sequences of the sacrum to include the sacroiliac joints and the coccyx were obtained without intravenous contrast.  Comparison:  MRI 09/28/2011  Findings:  Interval sacroplasty bilaterally with cement in the sacrum surrounded by edema.  This was not present previously. There is a nondisplaced fracture of the S2 segment which was present previously.  SI joints are intact.  No new fracture.  Image quality degraded by motion.  IMPRESSION:  Fracture of S2 is unchanged.  Interval bilateral sacroplasty with cement compared with the prior study.  No new fracture.  Original Report Authenticated By: Camelia Phenes, M.D.    6:10 PM I spoke with Dr. Ashley Royalty, [facility] about admitting pt for pain control, and to get on the right pain medications. Pt does not qualify for inpatient admission, because not enough pain management was provided in ED and pt has no medical necessety for inpatient services according to her. I was informed it was out of my scope of practice, and I allowed Dr. Shyrl Numbers speak with Dr. Ashley Royalty. She recommended placment. Pt's family do not want pt placed. Pt also not wanting to take any more pain medications at this time. She is still unable to move her legs or sit up without pain. Dr. Shyrl Numbers spoke with Dr. Corliss Skains, from his stand point, since MR negative, pt OK to be discharged or admitted. Dr. Shyrl Numbers to come speak with pt.   Spoke with family again. X-rays of hips and pelvis obtained. indeterminate age fx of right pubic rami, this is where pt is pointing to her pain. Possible subacute injury? Family agreeing to  take pt home and follow up with pcp. Pain management at home.      Lottie Mussel, [place] 09/30/11 [phone]

## 2011-09-28 NOTE — Progress Notes (Signed)
Patient ID: [name], female   DOB: 1920/01/24, 78 y.o.   MRN: [id number]  Rec'd call from PA in ED to admit patient [facility]. According to Maddock (PA). PT with pain since vertebroplasty on 09/14/2011. Pt has only received minimal pain medication as the family does not want the patient to take pain narcotics due to hallucinations that occur with narcotics. PA reports that family is unable to take patien thome but does not want pain medications stronger than Tylenol used to control patient's pain. I spoke with the ED Attending Physician who will speak with family to establish goals of care. I advised that SW would likely need to be engaged if patient's family requires the patient to have skilled placement.  Maurico Perrell A.

## 2011-09-28 NOTE — ED Notes (Addendum)
Per EMS: pt from home c/o lower back pain; pt had lumbar sx 2 weeks ago; pt denies any change or obvious injury; pt appears comfortable at present; per family increased pain with sitting; pt mae; pt dialysis and had dialysis yesterday

## 2011-09-28 NOTE — Telephone Encounter (Signed)
I spoke with Kohles's son Jake Shark this morning.  He stated that she is in extreme pain.  It is to the point that she cannot raise her legs.  She cannot fully handle her dialysis due to the pain.  The pain is in her hip and pelvic area.  The family cannot get her any relief at all.  Unfortunately, he stated that they cant ambulate her well at all.  Dr. Corliss Skains wanted to review the MRI images again.  Michael Litter RAD PA was notified.  She stated that if the pt is in this much pain, she needs to go to the ED.  She stated that the family should call EMS due to the pt being safely ambulated to the hospital without further injury.  Rinaldo Cloud explained the process that the pt would be going through.  I advised family of this.  She stated that the pt would arrive at the hospital and be evaluated by the ED phy.  Due to her Renal issues, a neph would get involved.    S/P Sacralplasty 09-14-11

## 2011-09-28 NOTE — ED Notes (Signed)
[facility] tray ordered spoke with Tabitha 

## 2011-10-01 NOTE — ED Provider Notes (Signed)
Medical screening examination/treatment/procedure(s) were performed by non-physician practitioner and as supervising physician I was immediately available for consultation/collaboration.   Joella Saefong, MD 10/01/11 0012 

## 2011-10-15 ENCOUNTER — Other Ambulatory Visit (HOSPITAL_COMMUNITY): Payer: Self-pay | Admitting: Nephrology

## 2011-10-15 DIAGNOSIS — N186 End stage renal disease: Secondary | ICD-10-CM

## 2011-10-28 ENCOUNTER — Other Ambulatory Visit (HOSPITAL_COMMUNITY): Payer: Self-pay | Admitting: Nephrology

## 2011-10-28 ENCOUNTER — Ambulatory Visit (HOSPITAL_COMMUNITY)
Admission: RE | Admit: 2011-10-28 | Discharge: 2011-10-28 | Disposition: A | Discharge status: Home / Self Care | Payer: Medicare Other | Source: Ambulatory Visit | Attending: Nephrology | Admitting: Nephrology

## 2011-10-28 DIAGNOSIS — N186 End stage renal disease: Secondary | ICD-10-CM

## 2011-10-28 DIAGNOSIS — T82898A Other specified complication of vascular prosthetic devices, implants and grafts, initial encounter: Secondary | ICD-10-CM | POA: Insufficient documentation

## 2011-10-28 DIAGNOSIS — I120 Hypertensive chronic kidney disease with stage 5 chronic kidney disease or end stage renal disease: Secondary | ICD-10-CM | POA: Insufficient documentation

## 2011-10-28 DIAGNOSIS — Z992 Dependence on renal dialysis: Secondary | ICD-10-CM | POA: Insufficient documentation

## 2011-10-28 DIAGNOSIS — Y849 Medical procedure, unspecified as the cause of abnormal reaction of the patient, or of later complication, without mention of misadventure at the time of the procedure: Secondary | ICD-10-CM | POA: Insufficient documentation

## 2011-10-28 MED ORDER — IOHEXOL 300 MG/ML  SOLN
100.0000 mL | Freq: Once | INTRAMUSCULAR | Status: AC | PRN
  Administered 2011-10-28: 50 mL via INTRAVENOUS

## 2011-11-04 ENCOUNTER — Ambulatory Visit
Admission: RE | Admit: 2011-11-04 | Discharge: 2011-11-04 | Disposition: A | Discharge status: Home / Self Care | Payer: Medicare Other | Source: Ambulatory Visit | Attending: Family Medicine | Admitting: Family Medicine

## 2011-11-04 ENCOUNTER — Other Ambulatory Visit: Payer: Self-pay | Admitting: Family Medicine

## 2011-11-04 DIAGNOSIS — R0989 Other specified symptoms and signs involving the circulatory and respiratory systems: Secondary | ICD-10-CM

## 2011-11-04 DIAGNOSIS — R079 Chest pain, unspecified: Secondary | ICD-10-CM

## 2011-11-29 ENCOUNTER — Telehealth (HOSPITAL_COMMUNITY): Payer: Self-pay

## 2011-11-29 ENCOUNTER — Ambulatory Visit (HOSPITAL_COMMUNITY)
Admission: RE | Admit: 2011-11-29 | Discharge: 2011-11-29 | Disposition: A | Discharge status: Home / Self Care | Payer: Medicare Other | Source: Ambulatory Visit | Attending: Interventional Radiology | Admitting: Interventional Radiology

## 2011-11-29 ENCOUNTER — Other Ambulatory Visit (HOSPITAL_COMMUNITY): Payer: Self-pay | Admitting: Interventional Radiology

## 2011-11-29 DIAGNOSIS — R262 Difficulty in walking, not elsewhere classified: Secondary | ICD-10-CM | POA: Insufficient documentation

## 2011-11-29 DIAGNOSIS — Z992 Dependence on renal dialysis: Secondary | ICD-10-CM | POA: Insufficient documentation

## 2011-11-29 DIAGNOSIS — IMO0002 Reserved for concepts with insufficient information to code with codable children: Secondary | ICD-10-CM

## 2011-11-29 DIAGNOSIS — S22009A Unspecified fracture of unspecified thoracic vertebra, initial encounter for closed fracture: Secondary | ICD-10-CM | POA: Insufficient documentation

## 2011-11-29 DIAGNOSIS — M549 Dorsalgia, unspecified: Secondary | ICD-10-CM

## 2011-11-29 DIAGNOSIS — X58XXXA Exposure to other specified factors, initial encounter: Secondary | ICD-10-CM | POA: Insufficient documentation

## 2011-11-29 DIAGNOSIS — J90 Pleural effusion, not elsewhere classified: Secondary | ICD-10-CM | POA: Insufficient documentation

## 2011-11-29 DIAGNOSIS — M8448XA Pathological fracture, other site, initial encounter for fracture: Secondary | ICD-10-CM | POA: Insufficient documentation

## 2011-11-29 DIAGNOSIS — M4000 Postural kyphosis, site unspecified: Secondary | ICD-10-CM | POA: Insufficient documentation

## 2011-11-29 NOTE — Telephone Encounter (Signed)
Mrs. Noser son-n-law called the this morning and stated that [name] is in extreme pain.  It is in between her shoulder blades.  He stated that it is the same type of pain as before.  He also stated that she is taking a 1/2 of tylenol because a whole one makes her loopy.  I will speak will Dr. Corliss Skains and call the family back.

## 2011-11-30 ENCOUNTER — Other Ambulatory Visit (HOSPITAL_COMMUNITY): Payer: Self-pay | Admitting: Interventional Radiology

## 2011-11-30 ENCOUNTER — Other Ambulatory Visit: Payer: Self-pay | Admitting: Radiology

## 2011-11-30 DIAGNOSIS — IMO0002 Reserved for concepts with insufficient information to code with codable children: Secondary | ICD-10-CM

## 2011-12-01 ENCOUNTER — Telehealth (INDEPENDENT_AMBULATORY_CARE_PROVIDER_SITE_OTHER): Payer: Self-pay

## 2011-12-01 NOTE — Telephone Encounter (Signed)
Per Dr Sena Slate supplies declined and asked to be referred to Dr Tanya Nones pts pcp. Dr Gerrit Friends has not seen pt in years and this needs to be followed by pts pcp.

## 2011-12-02 ENCOUNTER — Ambulatory Visit (HOSPITAL_COMMUNITY)
Admission: RE | Admit: 2011-12-02 | Discharge: 2011-12-02 | Disposition: A | Discharge status: Home / Self Care | Payer: Medicare Other | Source: Ambulatory Visit | Attending: Interventional Radiology | Admitting: Interventional Radiology

## 2011-12-02 DIAGNOSIS — IMO0002 Reserved for concepts with insufficient information to code with codable children: Secondary | ICD-10-CM

## 2011-12-02 DIAGNOSIS — Z01812 Encounter for preprocedural laboratory examination: Secondary | ICD-10-CM | POA: Insufficient documentation

## 2011-12-02 DIAGNOSIS — M8448XA Pathological fracture, other site, initial encounter for fracture: Secondary | ICD-10-CM | POA: Insufficient documentation

## 2011-12-02 DIAGNOSIS — Z538 Procedure and treatment not carried out for other reasons: Secondary | ICD-10-CM | POA: Insufficient documentation

## 2011-12-02 LAB — APTT: aPTT: 30 seconds (ref 24–37)

## 2011-12-02 LAB — COMPREHENSIVE METABOLIC PANEL
ALT: 14 U/L (ref 0–35)
AST: 29 U/L (ref 0–37)
Albumin: 3.4 g/dL — ABNORMAL LOW (ref 3.5–5.2)
CO2: 33 mEq/L — ABNORMAL HIGH (ref 19–32)
Calcium: 9.6 mg/dL (ref 8.4–10.5)
Chloride: 92 mEq/L — ABNORMAL LOW (ref 96–112)
GFR calc non Af Amer: 13 mL/min — ABNORMAL LOW (ref >90)
Sodium: 137 mEq/L (ref 135–145)
Total Bilirubin: 0.4 mg/dL (ref 0.3–1.2)

## 2011-12-02 LAB — CBC WITH DIFFERENTIAL/PLATELET
Basophils Absolute: 0 10*3/uL (ref 0.0–0.1)
Lymphocytes Relative: 11 % — ABNORMAL LOW (ref 12–46)
Neutro Abs: 8.8 10*3/uL — ABNORMAL HIGH (ref 1.7–7.7)
Platelets: 372 10*3/uL (ref 150–400)
RDW: 14.7 % (ref 11.5–15.5)
WBC: 11.2 10*3/uL — ABNORMAL HIGH (ref 4.0–10.5)

## 2011-12-02 MED ORDER — FENTANYL CITRATE 0.05 MG/ML IJ SOLN
INTRAMUSCULAR | Status: AC
  Filled 2011-12-02: qty 4

## 2011-12-02 MED ORDER — CEFAZOLIN SODIUM 1-5 GM-% IV SOLN
1.0000 g | Freq: Once | INTRAVENOUS | Status: AC
  Administered 2011-12-02: 1 g via INTRAVENOUS

## 2011-12-02 MED ORDER — NALOXONE HCL 0.4 MG/ML IJ SOLN
INTRAMUSCULAR | Status: AC | PRN
  Administered 2011-12-02: 0.2 mg via INTRAVENOUS

## 2011-12-02 MED ORDER — TOBRAMYCIN SULFATE 1.2 G IJ SOLR
INTRAMUSCULAR | Status: AC
  Filled 2011-12-02: qty 1.2

## 2011-12-02 MED ORDER — SODIUM CHLORIDE 0.9 % IV SOLN
INTRAVENOUS | Status: AC | PRN
  Administered 2011-12-02: 30 mL via INTRAVENOUS

## 2011-12-02 MED ORDER — FLUMAZENIL 0.5 MG/5ML IV SOLN
INTRAVENOUS | Status: AC | PRN
  Administered 2011-12-02: 0.2 mg via INTRAVENOUS

## 2011-12-02 MED ORDER — MIDAZOLAM HCL 2 MG/2ML IJ SOLN
INTRAMUSCULAR | Status: AC
  Filled 2011-12-02: qty 4

## 2011-12-02 MED ORDER — MIDAZOLAM HCL 5 MG/5ML IJ SOLN
INTRAMUSCULAR | Status: AC | PRN
  Administered 2011-12-02: 1 mg via INTRAVENOUS

## 2011-12-02 MED ORDER — CEFAZOLIN SODIUM-DEXTROSE 2-3 GM-% IV SOLR: 2.0000 g | Freq: Once | INTRAVENOUS | Status: DC

## 2011-12-02 MED ORDER — FLUMAZENIL 0.5 MG/5ML IV SOLN
INTRAVENOUS | Status: AC
  Filled 2011-12-02: qty 5

## 2011-12-02 MED ORDER — SODIUM CHLORIDE 0.9 % IV SOLN: Freq: Once | INTRAVENOUS | Status: DC

## 2011-12-02 MED ORDER — NALOXONE HCL 0.4 MG/ML IJ SOLN
INTRAMUSCULAR | Status: AC
  Filled 2011-12-02: qty 1

## 2011-12-02 MED ORDER — FENTANYL CITRATE 0.05 MG/ML IJ SOLN
INTRAMUSCULAR | Status: AC | PRN
  Administered 2011-12-02: 25 ug via INTRAVENOUS

## 2011-12-02 NOTE — ED Notes (Signed)
Dr Corliss Skains in speaking with pt and son in law.  Pt's neuro intact, MAE, VSS.  Son reports that pt is about at baseline.  Will D/C in one hour if continues to be stable.  Refuses pos at this time.  Family reports decreased appetite since T 10 FX.

## 2011-12-02 NOTE — H&P (Signed)
[name] is an 78 y.o. female.   Chief Complaint: painful Thoracic 10 fracture Previous kyphoplasty at Lumbar 1; Lumbar 2; Lumbar 5; Lumbar 4; and sacroplasty Scheduled for T 10 kyphoplasty/vertebroplasty today HPI: anemia; ESRD; glaucoma; HTN; COPD  Past Medical History  Diagnosis Date  . Anemia   . Cystoid macular edema   . Dialysis patient   . Cataract     h/o  . Glaucoma   . Hypertension   . Renal insufficiency   . COPD (chronic obstructive pulmonary disease)     Past Surgical History  Procedure Date  . Abdominal hysterectomy   . Appendectomy   . Cataract extraction   . Arteriovenous graft placement   . Thrombectomy w/ embolectomy 01/16/2011    Procedure: THROMBECTOMY ARTERIOVENOUS GORE-TEX GRAFT;  Surgeon: Juleen China, MD;  Location: [facility] OR;  Service: Vascular;  Laterality: Left;  repair of left arm graft bleed and revision of left arm graft.    No family history on file. Social History:  reports that she has never smoked. She does not have any smokeless tobacco history on file. She reports that she does not drink alcohol or use illicit drugs.  Allergies: No Known Allergies   (Not in a hospital admission)  No results found for this or any previous visit (from the past 48 hour(s)). No results found.  Review of Systems  Constitutional: Positive for weight loss. Negative for fever.  Respiratory: Negative for cough.   Cardiovascular: Negative for chest pain.  Gastrointestinal: Negative for nausea and vomiting.  Musculoskeletal: Positive for back pain.  Neurological: Positive for weakness. Negative for headaches.    Blood pressure 154/77, pulse 43, temperature 97.1 F (36.2 C), temperature source Oral, resp. rate 14, height 5\' 3"  (1.6 m), weight 105 lb (47.628 kg), SpO2 94.00%. Physical Exam  Constitutional: She is oriented to person, place, and time. She appears well-nourished.  HENT:       HOH  Cardiovascular: Normal rate and regular rhythm.     Murmur heard. Respiratory: Effort normal and breath sounds normal. She has no wheezes.  GI: Soft. Bowel sounds are normal. There is no tenderness.  Musculoskeletal:       Uses wc only; in bed always  Neurological: She is alert and oriented to person, place, and time.  Skin: Skin is dry.  Psychiatric: She has a normal mood and affect. Her behavior is normal. Judgment and thought content normal.     Assessment/Plan T 10 painful fx Scheduled for VP/KP today Has had many previous augmentations of spinal fxs Pt and son in law aware of procedure benefits and risks and agreeable to proceed Consent signed and in chart  Zebbie Ace A 12/02/2011, 9:28 AM

## 2011-12-02 NOTE — ED Notes (Addendum)
Moved to table., bagged and oral airway placed.  Sat drop to 96 but back to 100 with bagging.  Narcan obtained.

## 2011-12-02 NOTE — ED Notes (Signed)
IV team unable to find access.  Will notify MD

## 2011-12-02 NOTE — ED Notes (Signed)
O2 d/c'd, OJ given, more alert.

## 2011-12-02 NOTE — ED Notes (Signed)
Family in, Dr Corliss Skains talking with family. Moving all extremities though weak.  Moved to nurses station.

## 2011-12-02 NOTE — ED Notes (Signed)
Expirations quickly slowing, [employer] called for.  O2 increased.

## 2011-12-02 NOTE — ED Notes (Signed)
IV team here for restart.  Prior IV dislodged with move back to table during respiratory distress.

## 2011-12-02 NOTE — ED Notes (Signed)
O 2  @L /[place] started

## 2011-12-02 NOTE — ED Notes (Addendum)
Rapid response called.

## 2011-12-02 NOTE — ED Notes (Signed)
Switched to 2L/[place] Sat maintaing at 100%

## 2011-12-03 ENCOUNTER — Other Ambulatory Visit: Payer: Self-pay | Admitting: Physician Assistant

## 2011-12-07 ENCOUNTER — Ambulatory Visit (HOSPITAL_COMMUNITY)
Admission: RE | Admit: 2011-12-07 | Discharge: 2011-12-07 | Disposition: A | Discharge status: Home / Self Care | Payer: Medicare Other | Source: Ambulatory Visit | Attending: Physician Assistant | Admitting: Physician Assistant

## 2011-12-07 ENCOUNTER — Inpatient Hospital Stay (HOSPITAL_COMMUNITY)
Admission: EM | Admit: 2011-12-07 | Discharge: 2011-12-11 | DRG: 981 | Disposition: A | Discharge status: Home / Self Care | Payer: Medicare Other | Attending: Internal Medicine | Admitting: Internal Medicine

## 2011-12-07 ENCOUNTER — Other Ambulatory Visit: Payer: Self-pay

## 2011-12-07 ENCOUNTER — Emergency Department (HOSPITAL_COMMUNITY): Payer: Medicare Other

## 2011-12-07 ENCOUNTER — Encounter (HOSPITAL_COMMUNITY): Payer: Self-pay

## 2011-12-07 ENCOUNTER — Ambulatory Visit (HOSPITAL_COMMUNITY)
Admission: RE | Admit: 2011-12-07 | Discharge: 2011-12-07 | Disposition: A | Discharge status: Home / Self Care | Payer: Medicare Other | Source: Ambulatory Visit | Attending: Interventional Radiology | Admitting: Interventional Radiology

## 2011-12-07 DIAGNOSIS — Z01812 Encounter for preprocedural laboratory examination: Secondary | ICD-10-CM | POA: Insufficient documentation

## 2011-12-07 DIAGNOSIS — J4489 Other specified chronic obstructive pulmonary disease: Secondary | ICD-10-CM | POA: Diagnosis present

## 2011-12-07 DIAGNOSIS — R0602 Shortness of breath: Secondary | ICD-10-CM

## 2011-12-07 DIAGNOSIS — D7589 Other specified diseases of blood and blood-forming organs: Secondary | ICD-10-CM | POA: Diagnosis present

## 2011-12-07 DIAGNOSIS — J90 Pleural effusion, not elsewhere classified: Principal | ICD-10-CM | POA: Diagnosis present

## 2011-12-07 DIAGNOSIS — I120 Hypertensive chronic kidney disease with stage 5 chronic kidney disease or end stage renal disease: Secondary | ICD-10-CM | POA: Diagnosis present

## 2011-12-07 DIAGNOSIS — J9620 Acute and chronic respiratory failure, unspecified whether with hypoxia or hypercapnia: Secondary | ICD-10-CM | POA: Diagnosis present

## 2011-12-07 DIAGNOSIS — S22070A Wedge compression fracture of T9-T10 vertebra, initial encounter for closed fracture: Secondary | ICD-10-CM | POA: Diagnosis present

## 2011-12-07 DIAGNOSIS — N186 End stage renal disease: Secondary | ICD-10-CM | POA: Diagnosis present

## 2011-12-07 DIAGNOSIS — Z79899 Other long term (current) drug therapy: Secondary | ICD-10-CM

## 2011-12-07 DIAGNOSIS — H409 Unspecified glaucoma: Secondary | ICD-10-CM | POA: Diagnosis present

## 2011-12-07 DIAGNOSIS — S22009A Unspecified fracture of unspecified thoracic vertebra, initial encounter for closed fracture: Secondary | ICD-10-CM | POA: Diagnosis present

## 2011-12-07 DIAGNOSIS — D631 Anemia in chronic kidney disease: Secondary | ICD-10-CM | POA: Diagnosis present

## 2011-12-07 DIAGNOSIS — Z538 Procedure and treatment not carried out for other reasons: Secondary | ICD-10-CM | POA: Insufficient documentation

## 2011-12-07 DIAGNOSIS — N2581 Secondary hyperparathyroidism of renal origin: Secondary | ICD-10-CM | POA: Diagnosis present

## 2011-12-07 DIAGNOSIS — Z01818 Encounter for other preprocedural examination: Secondary | ICD-10-CM | POA: Insufficient documentation

## 2011-12-07 DIAGNOSIS — I10 Essential (primary) hypertension: Secondary | ICD-10-CM

## 2011-12-07 DIAGNOSIS — I959 Hypotension, unspecified: Secondary | ICD-10-CM | POA: Diagnosis present

## 2011-12-07 DIAGNOSIS — Z66 Do not resuscitate: Secondary | ICD-10-CM | POA: Diagnosis present

## 2011-12-07 DIAGNOSIS — Z992 Dependence on renal dialysis: Secondary | ICD-10-CM

## 2011-12-07 DIAGNOSIS — M8448XA Pathological fracture, other site, initial encounter for fracture: Secondary | ICD-10-CM | POA: Insufficient documentation

## 2011-12-07 DIAGNOSIS — H35359 Cystoid macular degeneration, unspecified eye: Secondary | ICD-10-CM

## 2011-12-07 DIAGNOSIS — X58XXXA Exposure to other specified factors, initial encounter: Secondary | ICD-10-CM | POA: Diagnosis present

## 2011-12-07 DIAGNOSIS — J449 Chronic obstructive pulmonary disease, unspecified: Secondary | ICD-10-CM | POA: Diagnosis present

## 2011-12-07 DIAGNOSIS — N289 Disorder of kidney and ureter, unspecified: Secondary | ICD-10-CM

## 2011-12-07 DIAGNOSIS — N039 Chronic nephritic syndrome with unspecified morphologic changes: Secondary | ICD-10-CM | POA: Diagnosis present

## 2011-12-07 LAB — CBC WITH DIFFERENTIAL/PLATELET
Eosinophils Absolute: 0.2 10*3/uL (ref 0.0–0.7)
Eosinophils Relative: 3 % (ref 0–5)
HCT: 40.1 % (ref 36.0–46.0)
HCT: 38.3 % (ref 36.0–46.0)
Hemoglobin: 12.5 g/dL (ref 12.0–15.0)
Hemoglobin: 12.1 g/dL (ref 12.0–15.0)
Lymphocytes Relative: 14 % (ref 12–46)
Lymphs Abs: 1.4 10*3/uL (ref 0.7–4.0)
MCH: 32 pg (ref 26.0–34.0)
MCHC: 31.2 g/dL (ref 30.0–36.0)
MCHC: 31.6 g/dL (ref 30.0–36.0)
MCV: 102.6 fL — ABNORMAL HIGH (ref 78.0–100.0)
MCV: 102.1 fL — ABNORMAL HIGH (ref 78.0–100.0)
Monocytes Absolute: 0.8 10*3/uL (ref 0.1–1.0)
Monocytes Absolute: 0.8 10*3/uL (ref 0.1–1.0)
Monocytes Relative: 8 % (ref 3–12)
Monocytes Relative: 7 % (ref 3–12)
Neutro Abs: 7.9 10*3/uL — ABNORMAL HIGH (ref 1.7–7.7)
Neutrophils Relative %: 74 % (ref 43–77)
RBC: 3.91 MIL/uL (ref 3.87–5.11)
WBC: 10.4 10*3/uL (ref 4.0–10.5)

## 2011-12-07 LAB — BLOOD GAS, ARTERIAL
Acid-Base Excess: 3.9 mmol/L — ABNORMAL HIGH (ref 0.0–2.0)
Bicarbonate: 29.7 mEq/L — ABNORMAL HIGH (ref 20.0–24.0)
O2 Saturation: 96 %
Patient temperature: 98.6
pO2, Arterial: 74.7 mmHg — ABNORMAL LOW (ref 80.0–100.0)

## 2011-12-07 LAB — CBC
HCT: 39.6 % (ref 36.0–46.0)
MCH: 32.1 pg (ref 26.0–34.0)
MCHC: 31.3 g/dL (ref 30.0–36.0)
RDW: 14.5 % (ref 11.5–15.5)

## 2011-12-07 LAB — BASIC METABOLIC PANEL
BUN: 7 mg/dL (ref 6–23)
BUN: 8 mg/dL (ref 6–23)
CO2: 36 mEq/L — ABNORMAL HIGH (ref 19–32)
CO2: 31 mEq/L (ref 19–32)
Chloride: 97 mEq/L (ref 96–112)
Chloride: 97 mEq/L (ref 96–112)
Creatinine, Ser: 3.48 mg/dL — ABNORMAL HIGH (ref 0.50–1.10)
GFR calc non Af Amer: 12 mL/min — ABNORMAL LOW (ref >90)
Glucose, Bld: 107 mg/dL — ABNORMAL HIGH (ref 70–99)
Potassium: 3.3 mEq/L — ABNORMAL LOW (ref 3.5–5.1)
Sodium: 139 mEq/L (ref 135–145)

## 2011-12-07 LAB — PROTIME-INR: INR: 0.92 (ref 0.00–1.49)

## 2011-12-07 MED ORDER — ACETAMINOPHEN 650 MG RE SUPP: 650.0000 mg | Freq: Four times a day (QID) | RECTAL | Status: DC | PRN

## 2011-12-07 MED ORDER — METHYLPREDNISOLONE SODIUM SUCC 40 MG IJ SOLR
40.0000 mg | Freq: Two times a day (BID) | INTRAMUSCULAR | Status: DC
  Administered 2011-12-07 – 2011-12-08 (×2): 40 mg via INTRAVENOUS
  Filled 2011-12-07 (×4): qty 1

## 2011-12-07 MED ORDER — POTASSIUM CHLORIDE 10 MEQ/100ML IV SOLN
10.0000 meq | INTRAVENOUS | Status: DC
  Administered 2011-12-07: 10 meq via INTRAVENOUS
  Filled 2011-12-07 (×2): qty 100

## 2011-12-07 MED ORDER — ASPIRIN-DIPYRIDAMOLE ER 25-200 MG PO CP12
1.0000 | ORAL_CAPSULE | Freq: Every day | ORAL | Status: DC
  Administered 2011-12-08 – 2011-12-11 (×4): 1 via ORAL
  Filled 2011-12-07 (×5): qty 1

## 2011-12-07 MED ORDER — ACETAMINOPHEN 325 MG PO TABS
650.0000 mg | ORAL_TABLET | Freq: Four times a day (QID) | ORAL | Status: DC | PRN
  Administered 2011-12-07 – 2011-12-11 (×3): 650 mg via ORAL
  Filled 2011-12-07 (×3): qty 2

## 2011-12-07 MED ORDER — IPRATROPIUM BROMIDE 0.02 % IN SOLN
0.5000 mg | Freq: Four times a day (QID) | RESPIRATORY_TRACT | Status: DC
  Administered 2011-12-07 – 2011-12-09 (×8): 0.5 mg via RESPIRATORY_TRACT
  Filled 2011-12-07 (×8): qty 2.5

## 2011-12-07 MED ORDER — SODIUM CHLORIDE 0.9 % IV SOLN
INTRAVENOUS | Status: DC
  Administered 2011-12-07: 50 mL/h via INTRAVENOUS

## 2011-12-07 MED ORDER — BENEPROTEIN PO POWD
1.0000 | Freq: Two times a day (BID) | ORAL | Status: DC
  Administered 2011-12-09 – 2011-12-11 (×2): 6 g via ORAL
  Filled 2011-12-07 (×3): qty 227

## 2011-12-07 MED ORDER — ONDANSETRON HCL 4 MG PO TABS: 4.0000 mg | ORAL_TABLET | Freq: Four times a day (QID) | ORAL | Status: DC | PRN

## 2011-12-07 MED ORDER — LATANOPROST 0.005 % OP SOLN
1.0000 [drp] | Freq: Every day | OPHTHALMIC | Status: DC
  Administered 2011-12-07 – 2011-12-10 (×4): 1 [drp] via OPHTHALMIC
  Filled 2011-12-07 (×2): qty 2.5

## 2011-12-07 MED ORDER — POTASSIUM CHLORIDE CRYS ER 20 MEQ PO TBCR: 40.0000 meq | EXTENDED_RELEASE_TABLET | Freq: Two times a day (BID) | ORAL | Status: DC

## 2011-12-07 MED ORDER — CEFAZOLIN SODIUM 1-5 GM-% IV SOLN
1.0000 g | Freq: Once | INTRAVENOUS | Status: DC
  Filled 2011-12-07: qty 50

## 2011-12-07 MED ORDER — DORZOLAMIDE HCL 2 % OP SOLN
1.0000 [drp] | Freq: Two times a day (BID) | OPHTHALMIC | Status: DC
  Administered 2011-12-07 – 2011-12-11 (×8): 1 [drp] via OPHTHALMIC
  Filled 2011-12-07 (×2): qty 10

## 2011-12-07 MED ORDER — ONDANSETRON HCL 4 MG/2ML IJ SOLN: 4.0000 mg | Freq: Four times a day (QID) | INTRAMUSCULAR | Status: DC | PRN

## 2011-12-07 MED ORDER — MIDODRINE HCL 5 MG PO TABS
15.0000 mg | ORAL_TABLET | ORAL | Status: DC
  Administered 2011-12-08: 15 mg via ORAL
  Filled 2011-12-07 (×3): qty 3

## 2011-12-07 MED ORDER — CEFAZOLIN SODIUM-DEXTROSE 2-3 GM-% IV SOLR
2.0000 g | Freq: Once | INTRAVENOUS | Status: DC
  Filled 2011-12-07: qty 50

## 2011-12-07 MED ORDER — IOHEXOL 300 MG/ML  SOLN
75.0000 mL | Freq: Once | INTRAMUSCULAR | Status: AC | PRN
  Administered 2011-12-07: 75 mL via INTRAVENOUS

## 2011-12-07 MED ORDER — FLUTICASONE-SALMETEROL 100-50 MCG/DOSE IN AEPB
1.0000 | INHALATION_SPRAY | Freq: Two times a day (BID) | RESPIRATORY_TRACT | Status: DC
Start: 2011-12-07 — End: 2011-12-08
  Administered 2011-12-07 – 2011-12-08 (×2): 1 via RESPIRATORY_TRACT
  Filled 2011-12-07: qty 14

## 2011-12-07 MED ORDER — LEVALBUTEROL HCL 0.63 MG/3ML IN NEBU
0.6300 mg | INHALATION_SOLUTION | Freq: Four times a day (QID) | RESPIRATORY_TRACT | Status: DC | PRN
  Filled 2011-12-07: qty 3

## 2011-12-07 MED ORDER — SODIUM CHLORIDE 0.9 % IJ SOLN
3.0000 mL | Freq: Two times a day (BID) | INTRAMUSCULAR | Status: DC
  Administered 2011-12-08: 3 mL via INTRAVENOUS

## 2011-12-07 MED ORDER — MEGESTROL ACETATE 40 MG/ML PO SUSP
400.0000 mg | Freq: Two times a day (BID) | ORAL | Status: DC
  Administered 2011-12-08 – 2011-12-11 (×6): 400 mg via ORAL
  Filled 2011-12-07 (×10): qty 10

## 2011-12-07 MED ORDER — RESOURCE INSTANT PROTEIN PO PWD PACKET
1.0000 | Freq: Two times a day (BID) | ORAL | Status: DC
  Filled 2011-12-07: qty 6

## 2011-12-07 MED ORDER — BIOTENE DRY MOUTH MT LIQD
15.0000 mL | Freq: Two times a day (BID) | OROMUCOSAL | Status: DC
  Administered 2011-12-08 – 2011-12-11 (×5): 15 mL via OROMUCOSAL

## 2011-12-07 MED ORDER — TIMOLOL HEMIHYDRATE 0.5 % OP SOLN
1.0000 [drp] | Freq: Two times a day (BID) | OPHTHALMIC | Status: DC
  Administered 2011-12-08 – 2011-12-11 (×7): 1 [drp] via OPHTHALMIC
  Filled 2011-12-07 (×2): qty 0.1

## 2011-12-07 MED ORDER — ENOXAPARIN SODIUM 40 MG/0.4ML ~~LOC~~ SOLN
40.0000 mg | SUBCUTANEOUS | Status: DC
  Administered 2011-12-07: 40 mg via SUBCUTANEOUS
  Filled 2011-12-07 (×2): qty 0.4

## 2011-12-07 NOTE — ED Notes (Signed)
Pt receives dialysis M/W/F via graft LUE.  Pt with increased SOB.  Was upstairs for Kyphoplasty today and noted to be SOB and sent to ED for further evaluation.

## 2011-12-07 NOTE — ED Notes (Signed)
Pt sleeping doctor at the bedside

## 2011-12-07 NOTE — ED Provider Notes (Signed)
History     CSN: [id number]  Arrival date & time 12/07/11  1025   First MD Initiated Contact with Patient 12/07/11 1139      Chief Complaint  Patient presents with  . Shortness of Breath    (Consider location/radiation/quality/duration/timing/severity/associated sxs/prior treatment) HPI  78 year old female with hx of COPD, anemia, and dialysis (M-W-F)  presents for evaluation of pleural effusion found on cxr today.  Hx was obtained through son in law.  Patient has a history of thoracic spine compression fracture requiring kyphoplasty in the past. She was scheduled to have kyphoplasty procedure today, however on MRI 1 week ago  she was found to have a small left pleural effusion, and also evidence of CXR today. The son-in-law reports that patient has had increased labored breathing for the past several months. He also mentioned that patient had a recent schedule kyphoplasty procedure a week ago however while receiving sedation medication patient stopped breathing, which require reversal of medication and cessation of the procedure. In light of the new finding of left pleural effusion, her doctor would like patient to be admitted  And monitor to rule out infection prior to repeating kyphoplasty procedure.  Per son, pt has not been having fever, productive cough, hemoptysis, chest pain.  Pt is a nonsmoker, does not use O2 at home.    Past Medical History  Diagnosis Date  . Anemia   . Cystoid macular edema   . Dialysis patient   . Cataract     h/o  . Glaucoma   . Hypertension   . Renal insufficiency   . COPD (chronic obstructive pulmonary disease)     Past Surgical History  Procedure Date  . Abdominal hysterectomy   . Appendectomy   . Cataract extraction   . Arteriovenous graft placement   . Thrombectomy w/ embolectomy 01/16/2011    Procedure: THROMBECTOMY ARTERIOVENOUS GORE-TEX GRAFT;  Surgeon: Juleen China, MD;  Location: [facility] OR;  Service: Vascular;  Laterality: Left;  repair  of left arm graft bleed and revision of left arm graft.  . Kyphosis surgery     multiple levels lower thoracic and lumbar spine    No family history on file.  History  Substance Use Topics  . Smoking status: Never Smoker   . Smokeless tobacco: Not on file  . Alcohol Use: No    OB History    Grav Para Term Preterm Abortions TAB SAB Ect Mult Living                  Review of Systems  Unable to perform ROS: Other    Allergies  Review of patient's allergies indicates no known allergies.  Home Medications   Current Outpatient Rx  Name Route Sig Dispense Refill  . ALBUTEROL SULFATE HFA 108 (90 BASE) MCG/ACT IN AERS Inhalation Inhale 2 puffs into the lungs 4 (four) times daily as needed. For shortness of breath.    . ASPIRIN-DIPYRIDAMOLE ER 25-200 MG PO CP12 Oral Take 1 capsule by mouth daily.     . DORZOLAMIDE HCL 2 % OP SOLN Both Eyes Place 1 drop into both eyes 2 (two) times daily.     Marland Kitchen FAMOTIDINE 20 MG PO TABS Oral Take 20 mg by mouth at bedtime.     Marland Kitchen LATANOPROST 0.005 % OP SOLN Both Eyes Place 1 drop into both eyes at bedtime.     . MEGESTROL ACETATE 40 MG/ML PO SUSP Oral Take 400 mg by mouth 2 (two) times daily.     Marland Kitchen  MIDODRINE HCL 10 MG PO TABS Oral Take 15 mg by mouth 3 (three) times a week. Takes 1 tablet prior to each HD treatments on Monday Wednesday and friday    . RENA-VITE PO TABS Oral Take 1 tablet by mouth daily.    . RESOURCE INSTANT PROTEIN PO PWD PACKET Oral Take 1 scoop by mouth 2 (two) times daily with a meal.     . TIMOLOL HEMIHYDRATE 0.5 % OP SOLN Both Eyes Place 1 drop into both eyes 2 (two) times daily.       BP 150/80  Pulse 91  Temp 97.1 F (36.2 C) (Oral)  Resp 18  SpO2 100%  Physical Exam  Nursing note and vitals reviewed. Constitutional: She appears well-developed and well-nourished.  HENT:  Head: Normocephalic.       Mouth dry, mouth breathing  Eyes: Conjunctivae normal are normal.  Neck: Neck supple. No JVD present. No tracheal  deviation present.  Cardiovascular: Normal rate and regular rhythm.   Pulmonary/Chest: Effort normal. No stridor. She has no wheezes. She has no rales. She exhibits no tenderness.  Abdominal: Soft. She exhibits no distension. There is no tenderness.  Musculoskeletal: She exhibits no edema.  Neurological: GCS eye subscore is 3. GCS verbal subscore is 5. GCS motor subscore is 6.       Pt is sleepy, but arousable and follows simple command    ED Course  Procedures (including critical care time)  Labs Reviewed - No data to display Dg Chest 2 View  12/07/2011  *RADIOLOGY REPORT*  Clinical Data: Pre kyphoplasty evaluation.  Decreased breath sounds.  Evaluate for pleural effusion.  CHEST - 2 VIEW  Comparison: Chest x-ray 11/04/2011.  Findings: Small left pleural effusion.  Bibasilar (left greater than right) linear opacities, favored to represent subsegmental atelectasis.  No evidence of pulmonary edema.  Prominence of interstitial markings in the medial aspect of the right upper lobe, similar to prior examinations, likely represents scarring.  Heart size is normal. The patient is rotated to the right on today's exam, resulting in distortion of the mediastinal contours and reduced diagnostic sensitivity and specificity for mediastinal pathology.  Atherosclerosis in the thoracic aorta.  Vascular stent in the soft tissues of the visualized left upper extremity. Osteopenia.  Lateral projection and demonstrates multiple thoracic spine compression fractures, similar to prior examination, with the post vertebroplasty/kyphoplasty changes in the lower thoracic and upper lumbar spine.  IMPRESSION: 1.  Small left pleural effusion with bibasilar subsegmental atelectasis (left greater than right). 2.  Osteopenia with multiple vertebral body compression fractures and post procedural changes, similar to recent prior examination, as above. 3.  Atherosclerosis.   Original Report Authenticated By: Florencia Reasons, M.D.     Results for orders placed during the hospital encounter of 12/07/11  CBC WITH DIFFERENTIAL      Component Value Range   WBC 10.4  4.0 - 10.5 K/uL   RBC 3.75 (*) 3.87 - 5.11 MIL/uL   Hemoglobin 12.1  12.0 - 15.0 g/dL   HCT 40.9  81.1 - 91.4 %   MCV 102.1 (*) 78.0 - 100.0 fL   MCH 32.3  26.0 - 34.0 pg   MCHC 31.6  30.0 - 36.0 g/dL   RDW 78.2  95.6 - 21.3 %   Platelets 392  150 - 400 K/uL   Neutrophils Relative 76  43 - 77 %   Neutro Abs 7.9 (*) 1.7 - 7.7 K/uL   Lymphocytes Relative 14  12 - 46 %  Lymphs Abs 1.4  0.7 - 4.0 K/uL   Monocytes Relative 7  3 - 12 %   Monocytes Absolute 0.8  0.1 - 1.0 K/uL   Eosinophils Relative 3  0 - 5 %   Eosinophils Absolute 0.3  0.0 - 0.7 K/uL   Basophils Relative 0  0 - 1 %   Basophils Absolute 0.0  0.0 - 0.1 K/uL  BASIC METABOLIC PANEL      Component Value Range   Sodium 137  135 - 145 mEq/L   Potassium 3.7  3.5 - 5.1 mEq/L   Chloride 97  96 - 112 mEq/L   CO2 31  19 - 32 mEq/L   Glucose, Bld 91  70 - 99 mg/dL   BUN 8  6 - 23 mg/dL   Creatinine, Ser 4.09 (*) 0.50 - 1.10 mg/dL   Calcium 9.7  8.4 - 81.1 mg/dL   GFR calc non Af Amer 11 (*) >90 mL/min   GFR calc Af Amer 12 (*) >90 mL/min  TROPONIN I      Component Value Range   Troponin I <0.30  <0.30 ng/mL   Dg Chest 2 View  12/07/2011  *RADIOLOGY REPORT*  Clinical Data: Pre kyphoplasty evaluation.  Decreased breath sounds.  Evaluate for pleural effusion.  CHEST - 2 VIEW  Comparison: Chest x-ray 11/04/2011.  Findings: Small left pleural effusion.  Bibasilar (left greater than right) linear opacities, favored to represent subsegmental atelectasis.  No evidence of pulmonary edema.  Prominence of interstitial markings in the medial aspect of the right upper lobe, similar to prior examinations, likely represents scarring.  Heart size is normal. The patient is rotated to the right on today's exam, resulting in distortion of the mediastinal contours and reduced diagnostic sensitivity and specificity  for mediastinal pathology.  Atherosclerosis in the thoracic aorta.  Vascular stent in the soft tissues of the visualized left upper extremity. Osteopenia.  Lateral projection and demonstrates multiple thoracic spine compression fractures, similar to prior examination, with the post vertebroplasty/kyphoplasty changes in the lower thoracic and upper lumbar spine.  IMPRESSION: 1.  Small left pleural effusion with bibasilar subsegmental atelectasis (left greater than right). 2.  Osteopenia with multiple vertebral body compression fractures and post procedural changes, similar to recent prior examination, as above. 3.  Atherosclerosis.   Original Report Authenticated By: Florencia Reasons, M.D.    Mr Thoracic Spine Wo Contrast  11/29/2011  *RADIOLOGY REPORT*  Clinical Data: 78 year old female with severe back pain.  Possible compression fracture.  Unable to ambulate.  Multiple prior vertebroplasties.  Dialysis.  Kyphosis.  MRI THORACIC SPINE WITHOUT CONTRAST  Technique:  Multiplanar and multiecho pulse sequences of the thoracic spine were obtained without intravenous contrast.  Comparison: Lumbar MRI 09/28/2011.  Chest CTA 10/22/2008.  Findings: There is marrow edema throughout the T10 vertebral body, which appeared normal in July.   There is now a patchy decreased T2 signal, but on sagittal T1 no evidence of augmentation with bone cement at this level. T10 loss of height is less than 25% compared to July.  Trace marrow edema in the right pedicle, no definite marrow edema in the left pedicle.  Minimal retropulsion of the posterior-superior endplate without significant spinal stenosis.  Chronic severe T5 and T7 compression fractures.  Chronic T12 compression fracture status post augmentation.  Multiple chronic lumbar level compression fractures also status post augmentation. No other acute osseous abnormality in the thoracic spine.  Left greater than right layering pleural effusions.  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  Thoracic spinal canal and thecal sac are patent without significant spinal stenosis. Spinal cord signal is within normal limits at all visualized levels.  Conus medullaris at L1-L2. Negative limited sagittal imaging of the cervical spine.  IMPRESSION: 1.  Acute or subacute T10 compression fracture.  Minimal loss of height.  Minimal retropulsion of bone without significant spinal stenosis.  Mild edema in the right pedicle. 2.  Chronic T5, T7, T12, and multilevel lumbar compression fractures. 3.  Layering left greater than right pleural effusions. Hemosiderosis.   Original Report Authenticated By: Harley Hallmark, M.D.           12:49 PM  Date: 12/07/2011  Rate: 86  Rhythm: normal sinus rhythm and premature atrial contractions (PAC)  QRS Axis: left  Intervals: QT prolonged  ST/T Wave abnormalities: normal  Conduction Disutrbances:right bundle branch block and left anterior fascicular block  Narrative Interpretation: Abnormal EKG  Old EKG Reviewed: unchanged      1. Shortness of breath 2. Pleural effusion  MDM  Pt was brought to ER for admission to r/o lung infection prior to having kyphoplasty to repair thoracic compression fx.    She is currently afebrile and in no apparent resp distress.     2:14 PM Labs shows no evidence of infection.  Pt sats 100% on 2L [place], is afebrile.  I have consulted with [facility], who agrees to admit pt for further evaluation of her pleural effusion.  Since pt is a dialysis pt and not making urine, i have ordered a chest CT w/CM as requested by Hospitalist.  Pt to be admit to tele bed, Team 6, under the care of Dr. Deeann Dowse.  My attending is aware of plan.    BP 93/50  Pulse 84  Temp 97.1 F (36.2 C) (Oral)  Resp 26  SpO2 100%  Nursing notes reviewed and considered in documentation  Previous records reviewed and considered  All labs/vitals  reviewed and considered  xrays reviewed and considered   Fayrene Helper, PA-C 12/07/11 1501   3:10 PM  [facility] felt that pt does not meet criteria for admission.  As she is not hypoxic, she has no significant ECG changes, normal troponin, and CXR only remarkable for a small L pleural effusion.  If chest ct shows no signs of infection, pt will be d/c to have close follow up and dialysis tomorrow.    3:30 PM On reconsideration, [facility] will admit pt for observation to medically cleared her prior to repeat kyphoplasty procedure.  Pt will be admitted per [facility] recommendation.    Fayrene Helper, PA-C 12/07/11 1531

## 2011-12-07 NOTE — H&P (Signed)
[name] is an 78 y.o. female.   Chief Complaint: acute T10 compression fracture.  Presents today for KP of this level.   HPI: Hx of multiple compression fractures in past s/p KP's.  Pain is affecting her appetite, ability to ambulate and possible breathing per family.  Past Medical History  Diagnosis Date  . Anemia   . Cystoid macular edema   . Dialysis patient   . Cataract     h/o  . Glaucoma   . Hypertension   . Renal insufficiency   . COPD (chronic obstructive pulmonary disease)     Past Surgical History  Procedure Date  . Abdominal hysterectomy   . Appendectomy   . Cataract extraction   . Arteriovenous graft placement   . Thrombectomy w/ embolectomy 01/16/2011    Procedure: THROMBECTOMY ARTERIOVENOUS GORE-TEX GRAFT;  Surgeon: Juleen China, MD;  Location: [facility] OR;  Service: Vascular;  Laterality: Left;  repair of left arm graft bleed and revision of left arm graft.  . Kyphosis surgery     multiple levels lower thoracic and lumbar spine    Social History:  reports that she has never smoked. She does not have any smokeless tobacco history on file. She reports that she does not drink alcohol or use illicit drugs.  Allergies: No Known Allergies   Results for orders placed during the hospital encounter of 12/07/11 (from the past 48 hour(s))  APTT     Status: Normal   Collection Time   12/07/11  7:35 AM      Component Value Range Comment   aPTT 24  24 - 37 seconds   BASIC METABOLIC PANEL     Status: Abnormal   Collection Time   12/07/11  7:35 AM      Component Value Range Comment   Sodium 139  135 - 145 mEq/L    Potassium 3.3 (*) 3.5 - 5.1 mEq/L    Chloride 97  96 - 112 mEq/L    CO2 36 (*) 19 - 32 mEq/L    Glucose, Bld 107 (*) 70 - 99 mg/dL    BUN 7  6 - 23 mg/dL    Creatinine, Ser 4.09 (*) 0.50 - 1.10 mg/dL    Calcium 9.9  8.4 - 81.1 mg/dL    GFR calc non Af Amer 12 (*) >90 mL/min    GFR calc Af Amer 14 (*) >90 mL/min   CBC WITH DIFFERENTIAL     Status:  Abnormal   Collection Time   12/07/11  7:35 AM      Component Value Range Comment   WBC 9.3  4.0 - 10.5 K/uL    RBC 3.91  3.87 - 5.11 MIL/uL    Hemoglobin 12.5  12.0 - 15.0 g/dL    HCT 91.4  78.2 - 95.6 %    MCV 102.6 (*) 78.0 - 100.0 fL    MCH 32.0  26.0 - 34.0 pg    MCHC 31.2  30.0 - 36.0 g/dL    RDW 21.3  08.6 - 57.8 %    Platelets 407 (*) 150 - 400 K/uL    Neutrophils Relative 74  43 - 77 %    Neutro Abs 6.9  1.7 - 7.7 K/uL    Lymphocytes Relative 15  12 - 46 %    Lymphs Abs 1.4  0.7 - 4.0 K/uL    Monocytes Relative 8  3 - 12 %    Monocytes Absolute 0.8  0.1 - 1.0 K/uL  Eosinophils Relative 2  0 - 5 %    Eosinophils Absolute 0.2  0.0 - 0.7 K/uL    Basophils Relative 1  0 - 1 %    Basophils Absolute 0.1  0.0 - 0.1 K/uL   PROTIME-INR     Status: Normal   Collection Time   12/07/11  7:35 AM      Component Value Range Comment   Prothrombin Time 12.3  11.6 - 15.2 seconds    INR 0.92  0.00 - 1.49    RADIOLOGY REPORT*  Clinical Data: 78 year old female with severe back pain. Possible  compression fracture. Unable to ambulate. Multiple prior  vertebroplasties. Dialysis. Kyphosis.  MRI THORACIC SPINE WITHOUT CONTRAST  Technique: Multiplanar and multiecho pulse sequences of the  thoracic spine were obtained without intravenous contrast.  Comparison: Lumbar MRI 09/28/2011. Chest CTA 10/22/2008.  Findings: There is marrow edema throughout the T10 vertebral body,  which appeared normal in July. There is now a patchy decreased T2  signal, but on sagittal T1 no evidence of augmentation with bone  cement at this level.  T10 loss of height is less than 25% compared to July. Trace marrow  edema in the right pedicle, no definite marrow edema in the left  pedicle. Minimal retropulsion of the posterior-superior endplate  without significant spinal stenosis.  Chronic severe T5 and T7 compression fractures. Chronic T12  compression fracture status post augmentation. Multiple chronic    lumbar level compression fractures also status post augmentation.  No other acute osseous abnormality in the thoracic spine.  Left greater than right layering pleural effusions. Bilateral  multicystic kidney disease, probably cystic renal disease of  dialysis. 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  Thoracic spinal canal and thecal sac are patent without significant  spinal stenosis. Spinal cord signal is within normal limits at all  visualized levels. Conus medullaris at L1-L2. Negative limited  sagittal imaging of the cervical spine.  IMPRESSION:  1. Acute or subacute T10 compression fracture. Minimal loss of  height. Minimal retropulsion of bone without significant spinal  stenosis. Mild edema in the right pedicle.  2. Chronic T5, T7, T12, and multilevel lumbar compression  fractures.  3. Layering left greater than right pleural effusions.  Hemosiderosis.  Original Report Authenticated By: Harley Hallmark, M.D.  Review of Systems  Constitutional: Positive for weight loss and malaise/fatigue. Negative for fever and chills.  Respiratory: Positive for cough and shortness of breath. Negative for hemoptysis, sputum production and wheezing.   Cardiovascular: Negative for chest pain, claudication and leg swelling.  Gastrointestinal: Negative for heartburn, nausea and vomiting.  Genitourinary:       Esrd   Musculoskeletal: Positive for myalgias and back pain.  Neurological: Positive for focal weakness and weakness. Negative for dizziness, tingling, speech change, seizures and loss of consciousness.  Endo/Heme/Allergies: Bruises/bleeds easily.  Psychiatric/Behavioral: Positive for memory loss. The patient is not nervous/anxious.     Blood pressure 131/73, pulse 69, temperature 97 F (36.1 C), temperature source Oral, resp. rate 14, height 5\' 3"  (1.6 m), weight 105 lb (47.628 kg), SpO2 97.00%. Physical Exam  Constitutional: No distress.       Thin, frail appearing   HENT:   Head: Atraumatic.  Cardiovascular: Normal rate.  Exam reveals no gallop and no friction rub.   Murmur heard. Respiratory: She has no wheezes. She has no rales.       Labored appearing with Diminished air flow bilaterally with accessory muscle use to breath  GI:  Soft. Bowel sounds are normal. She exhibits no distension.  Musculoskeletal: She exhibits tenderness. She exhibits no edema.       Tender to palpation at T10 level with radiation around to chest with deep breathing   Neurological: She is alert.  Skin: Skin is warm and dry. Rash noted.  Psychiatric: She has a normal mood and affect. Her behavior is normal.     Assessment/Plan Per son in law - patient had complete course of HD yesterday.  Hypokalemic on today's labs - will replete.  Given labored appearing breathing - will check CXR to evaluate prior to sedation.  Procedure details reviewed with family.  Concerns with respiratory distress with moderate sedation as well as cardiac events given age and renal patient.  Family verbalized their understanding and are in agreement to proceed with MD comfortable with CXR findings prior to kyphoplasty at T10 level.   Freddye Cardamone D 12/07/2011, 8:38 AM

## 2011-12-07 NOTE — ED Notes (Signed)
Pt here for sob when she went to have kyphoplasty of back, sts last weekwas also scheduled for same and stopped breathing with fentanyl and versed administration, today was still sob and K was low so sent her here for furhtur eval

## 2011-12-07 NOTE — Progress Notes (Signed)
Received telephone transfer report from Olin Pia., RN in the ED. Patient admitted to unit, son-in-law at bedside. Pt is asleep and obtunded; awakes only to repeated verbal stimuli and quickly goes back to sleep. Patient shows slight facial grimacing with movement from ED bed to unit bed. Patient's resperations 24 breaths/minute with slight accessory muscle use. Patient on 2 L Oxygen via [place]; SpO2 100%.  All other Vital Signs WNL. Bed in lowest position with HOB at 30 degrees. Family oriented to staff, call bell, and room. Full assessment and medication administration record to Epic. Will continue to monitor closely. Elevated respirations and facial grimacing relayed to night shift nurse in report. Fayne Norrie, RN

## 2011-12-07 NOTE — Progress Notes (Signed)
Event: RN notified of critical ABG results. Pt noted to be DNR status in EPIC, however RN reports family may be requesting to rescind. NP to bedside. Subjective: Pt unable to provide information. Family not at beside. Spoke with pt's daughter "Coralee North" by phone and son-in-law (power of attorney). After some discussion family request that we move forward with Bi-PAP and other appropriate medical interventions that may improve her current status but ultimately they do not want pt to be intubated or to have chest compressions if her conditions continue to deteriorate. Objective: 78 year old female admitted today for kypho[plasty that was ultimately cancelled due to CXR that revealed small (L) sided pleural effusion.  Family indicated in ED that pt has been SOB x 3 to 4 years but much worse over the last 3 to 4 weeks. At bedside well developed elderly female noted lying quietly in bed in NAD. Arouses easily to tactile stimulation. Will open eyes to voice but otherwise very lethargic. Unable to follow simple commands. BBS diminished, very shallow resp. RR currently 26. VS T-97.9, BP 101/57,  P-89 (w/ RRR), RR-26 w/ 02 sats of 95% on 2L [place]. ABG results  PH-7.31, pC02-60.6, p02-74.7, Bicarb-29.7 on 2L [place] @ 2035. CXR today revealed IMPRESSION:  1. Small left pleural effusion with bibasilar subsegmental  atelectasis (left greater than right).  2. Osteopenia with multiple vertebral body compression fractures  and post procedural changes, similar to recent prior examination,  as above.  3. Atherosclerosis.  Original Report Authenticated By: Florencia Reasons, M.D. Assessment/Plan 1. Respiratory failure w/ hypercapnea in clinical setting of 78 y/o pt w/ small (L) pleural effusion, COPD and ESRD on HD. Will place pt on Bi-PAP and repeat ABG 1 hour after placed on Bi-PAP. Pt will be moved to SDU for close monitoring.  Leanne Chang, NP-C [facility] Pager [phone]

## 2011-12-07 NOTE — ED Provider Notes (Signed)
Medical screening examination/treatment/procedure(s) were performed by non-physician practitioner and as supervising physician I was immediately available for consultation/collaboration.   Carleene Cooper III, MD 12/07/11 2023

## 2011-12-07 NOTE — H&P (Signed)
[facility] History and Physical  [name] ZOX:[id number] DOB: 1920-02-23 DOA: 12/07/2011  Referring physician: Carleene Cooper III, MD  PCP: Leo Grosser, MD   Chief Complaint: Shortness of breath  HPI:  78 year old female with Hx of multiple compression fractures in past s/p KP's. Pain is affecting her appetite, ability to ambulate and possible breathing per family.Marland Kitchen She was scheduled to have kyphoplasty procedure today, procedure was canceled small left pleural effusion.  Patient's son-in-law is the power of attorney who provides most of the history patient is completely nonfocal but arousable only to sternal rub with minimal eye opening and mumbling. Chest x-ray was done because of shortness of breath. It showed a small left-sided pleural effusion, the patient was sent to the ER for further evaluation, because of concern about respiratory distress with moderate sedation as well as cardiac event given the patient's age.  Son-in-law suggested that the patient has been short of breath for 3 or 4 years. But worse over the last 3 weeks, subsequent to patient's T10 fracture, compression fracture. She is currently not on any pain medications except occasional Tylenol, She has had no fever chills or rigors, no hemoptysis, chest pain        Review of Systems: negative for the following   Constitutional: Chills, Fever, Unexplained weight loss, Unexplained recent fatigue, Change in appetite  Eyes: Blurred vision, redness in eyes, pain in eyes  Ears, Nose, Mouth, Throat: Unexplained decreased hearing, sore throat swollen glands  Cardiovascular: Chest pain, irregular heartbeat, frequent dizziness upon standing up, shortness of breath lying down  Respiratory: Cough, wheezing, shortness of breath   Gastrointestinal: Moderate to severe abdominal pain, diarrhea, blood in stool  Genitourinary: Blood in urine, difficulty urinating. Women only, change in menstrual period, irregular  menstrual periods, excessively heavy bleeding   Musculoskeletal: Arthritis, abnormal weakness of muscles   Breasts: New breast lumps, nipple discharge or irritation   Neurological: Dizziness, numbing/tingling, loss of balance when walking   Psychiatric: Depression, mood swings, significant and unexplained memory loss  Endocrine: Excessively hot or cold, unexplained weight loss or gain   Hematologic/Lymphatic: Swollen glands, easy bruising   Allergic/Immunologic: Hives, runny nose, sneezing      Past Medical History  Diagnosis Date  . Anemia   . Cystoid macular edema   . Dialysis patient   . Cataract     h/o  . Glaucoma   . Hypertension   . Renal insufficiency   . COPD (chronic obstructive pulmonary disease)      Past Surgical History  Procedure Date  . Abdominal hysterectomy   . Appendectomy   . Cataract extraction   . Arteriovenous graft placement   . Thrombectomy w/ embolectomy 01/16/2011    Procedure: THROMBECTOMY ARTERIOVENOUS GORE-TEX GRAFT;  Surgeon: Juleen China, MD;  Location: [facility] OR;  Service: Vascular;  Laterality: Left;  repair of left arm graft bleed and revision of left arm graft.  . Kyphosis surgery     multiple levels lower thoracic and lumbar spine      Social History:  reports that she has never smoked. She does not have any smokeless tobacco history on file. She reports that she does not drink alcohol or use illicit drugs. History  Substance Use Topics  .  Smoking status:  Never Smoker  .  Smokeless tobacco:  Not on file  .  Alcohol Use:  No    No Known Allergies  No family history on file.   Prior to Admission medications  Medication Sig Start Date End Date Taking? Authorizing Provider  albuterol (PROVENTIL HFA;VENTOLIN HFA) 108 (90 BASE) MCG/ACT inhaler Inhale 2 puffs into the lungs 4 (four) times daily as needed. For shortness of breath.   Yes Historical Provider, MD  dipyridamole-aspirin (AGGRENOX) 25-200 MG per 12 hr capsule Take  1 capsule by mouth daily.    Yes Historical Provider, MD  dorzolamide (TRUSOPT) 2 % ophthalmic solution Place 1 drop into both eyes 2 (two) times daily.    Yes Historical Provider, MD  famotidine (PEPCID) 20 MG tablet Take 20 mg by mouth at bedtime.    Yes Historical Provider, MD  latanoprost (XALATAN) 0.005 % ophthalmic solution Place 1 drop into both eyes at bedtime.    Yes Historical Provider, MD  megestrol (MEGACE) 40 MG/ML suspension Take 400 mg by mouth 2 (two) times daily.    Yes Historical Provider, MD  midodrine (PROAMATINE) 10 MG tablet Take 15 mg by mouth 3 (three) times a week. Takes 1 tablet prior to each HD treatments on Monday Wednesday and friday   Yes Historical Provider, MD  multivitamin (RENA-VIT) TABS tablet Take 1 tablet by mouth daily.   Yes Historical Provider, MD  protein supplement (RESOURCE BENEPROTEIN) 6 g POWD Take 1 scoop by mouth 2 (two) times daily with a meal.    Yes Historical Provider, MD  timolol (BETIMOL) 0.5 % ophthalmic solution Place 1 drop into both eyes 2 (two) times daily.    Yes Historical Provider, MD     Physical Exam: Filed Vitals:   12/07/11 1507 12/07/11 1656 12/07/11 1835 12/07/11 1916  BP: 121/50 131/109 125/69   Pulse: 77  80   Temp:      TempSrc:      Resp: 26 18 24    Weight:    45.09 kg (99 lb 6.5 oz)  SpO2: 97% 100% 100%     Constitutional: No distress.  Thin, frail appearing HENT:  Head: Atraumatic.  Cardiovascular: Normal rate. Exam reveals no gallop and no friction rub.  Murmur heard.  Respiratory: She has no wheezes. She has no rales.  Labored appearing with Diminished air flow bilaterally with accessory muscle use to breath  GI: Soft. Bowel sounds are normal. She exhibits no distension.  Musculoskeletal: She exhibits tenderness. She exhibits no edema.  Tender to palpation at T10 level with radiation around to chest with deep breathing  Neurological: She is alert.  Skin: Skin is warm and dry. Rash noted.  Psychiatric: She  has a normal mood and affect. Her behavior is normal.         Labs on Admission:    Basic Metabolic Panel:  Lab 12/07/11 1610 12/07/11 0735 12/02/11 0843  NA 137 139 137  K 3.7 3.3* 3.2*  CL 97 97 92*  CO2 31 36* 33*  GLUCOSE 91 107* 87  BUN 8 7 8   CREATININE 3.48* 3.23* 3.02*  CALCIUM 9.7 9.9 9.6  MG -- -- --  PHOS -- -- --   Liver Function Tests:  Lab 12/02/11 0843  AST 29  ALT 14  ALKPHOS 237*  BILITOT 0.4  PROT 7.5  ALBUMIN 3.4*   No results found for this basename: LIPASE:5,AMYLASE:5 in the last 168 hours No results found for this basename: AMMONIA:5 in the last 168 hours CBC:  Lab 12/07/11 1215 12/07/11 0735 12/02/11 0843  WBC 10.4 9.3 11.2*  NEUTROABS 7.9* 6.9 8.8*  HGB 12.1 12.5 11.5*  HCT 38.3 40.1 36.1  MCV 102.1* 102.6* 102.3*  PLT  392 407* 372   Cardiac Enzymes:  Lab 12/07/11 1216  CKTOTAL --  CKMB --  CKMBINDEX --  TROPONINI <0.30    BNP (last 3 results) No results found for this basename: PROBNP:3 in the last 8760 hours    CBG: No results found for this basename: GLUCAP:5 in the last 168 hours  Radiological Exams on Admission: Dg Chest 2 View  12/07/2011  *RADIOLOGY REPORT*  Clinical Data: Pre kyphoplasty evaluation.  Decreased breath sounds.  Evaluate for pleural effusion.  CHEST - 2 VIEW  Comparison: Chest x-ray 11/04/2011.  Findings: Small left pleural effusion.  Bibasilar (left greater than right) linear opacities, favored to represent subsegmental atelectasis.  No evidence of pulmonary edema.  Prominence of interstitial markings in the medial aspect of the right upper lobe, similar to prior examinations, likely represents scarring.  Heart size is normal. The patient is rotated to the right on today's exam, resulting in distortion of the mediastinal contours and reduced diagnostic sensitivity and specificity for mediastinal pathology.  Atherosclerosis in the thoracic aorta.  Vascular stent in the soft tissues of the visualized left  upper extremity. Osteopenia.  Lateral projection and demonstrates multiple thoracic spine compression fractures, similar to prior examination, with the post vertebroplasty/kyphoplasty changes in the lower thoracic and upper lumbar spine.  IMPRESSION: 1.  Small left pleural effusion with bibasilar subsegmental atelectasis (left greater than right). 2.  Osteopenia with multiple vertebral body compression fractures and post procedural changes, similar to recent prior examination, as above. 3.  Atherosclerosis.   Original Report Authenticated By: Florencia Reasons, M.D.    Ct Chest W Contrast  12/07/2011  *RADIOLOGY REPORT*  Clinical Data: COPD.  Dialysis patient.  Presents with pleural effusion today.  Decreased breath sounds.  CT CHEST WITH CONTRAST  Technique:  Multidetector CT imaging of the chest was performed following the standard protocol during bolus administration of intravenous contrast.  Contrast: 75mL OMNIPAQUE IOHEXOL 300 MG/ML  SOLN  Comparison: 12/07/2011 chest x-ray.  08/22/2008 chest CT.  Thoracic spine MR 11/29/2011.  Findings: Thoracic kyphosis.  Prior cement augmentation T11, T12 and L1.  Compression fractures C3, T5, T7 and T10 as noted on the recent MR.  Bibasilar atelectasis/infiltrates with pleural effusions greater on the left.  Cardiomegaly.  Coronary artery calcifications.  Marked ectasia of the thoracic aorta ascending thoracic aorta measures up to 3.3 cm and descending thoracic aorta measures up to 3.4 cm.  Bilateral renal cyst.  Small cyst within the liver.  Minimal nodularity of the thyroid gland.  IMPRESSION: Bibasilar atelectasis/infiltrates with pleural effusions greater on the left.  Thoracic kyphosis.  Prior cement augmentation T11, T12 and L1. Compression fractures C3, T5, T7 and T10 as noted on the recent MR.  Cardiomegaly with coronary calcifications.  Prominent ectasia thoracic aorta and great vessels.   Original Report Authenticated By: Fuller Canada, M.D.     EKG:  Rate: 86  Rhythm: normal sinus rhythm and premature atrial contractions (PAC)  QRS Axis: left  Intervals: QT prolonged  ST/T Wave abnormalities: normal  Conduction Disutrbances:right bundle branch block and left anterior fascicular block  Narrative Interpretation: Abnormal EKG  Old EKG Reviewed: unchanged   Assessment/Plan Principal Problem:  *SOB (shortness of breath) Active Problems:  Dialysis patient  Hypertension  Renal insufficiency  End stage renal disease   1. Shortness of breath, likely chronic secondary to underlying COPD worsening over the last 3-4 years. Patient is not hypoxic. Given that she will do an ABG. CT scan was done  and performed the patient is basal atelectasis, possible infiltrate. Patient has no clinical symptoms of infection, doubt she has pneumonia. I suspect that she has bipedal atelectasis secondary to her multiple compression fractures and poor pulmonary toilet, as well as restrictive lung disease secondary to her kyphosis. Will treat underlying COPD with nebulizers, low-dose IV steroids, hold off on antibiotics for now given no definite source of infection. We'll cycle cardiac enzymes also obtain a 2-D echo. 2. Compression fracture Will consult interventional radiology in the morning if clinically stable overnight 3. End Stage renal disease ,dialysis (M-W-F), please nephrology in the morning currently for dialysis tomorrow, patient not hypoxic or volume overloaded at this, 3. COPD start patient on Advair, nebulizer treatments, low-dose steroids  Code Status: DNR/DNI Family Communication: bedside Disposition Plan: admit   Time spent: 70 mins   [facility] Pager [phone]  If 7PM-7AM, please contact night-coverage [email or web address] Password [email or web address] 12/07/2011, 7:52 PM

## 2011-12-07 NOTE — ED Provider Notes (Signed)
12:49 PM  Date: 12/07/2011  Rate: 86  Rhythm: normal sinus rhythm and premature atrial contractions (PAC)  QRS Axis: left  Intervals: QT prolonged  ST/T Wave abnormalities: normal  Conduction Disutrbances:right bundle branch block and left anterior fascicular block  Narrative Interpretation: Abnormal EKG  Old EKG Reviewed: unchanged    Carleene Cooper III, MD 12/07/11 1251

## 2011-12-08 ENCOUNTER — Observation Stay (HOSPITAL_COMMUNITY): Payer: Medicare Other

## 2011-12-08 DIAGNOSIS — R0602 Shortness of breath: Secondary | ICD-10-CM

## 2011-12-08 LAB — CBC
HCT: 33.9 % — ABNORMAL LOW (ref 36.0–46.0)
RBC: 3.38 MIL/uL — ABNORMAL LOW (ref 3.87–5.11)
RDW: 14.5 % (ref 11.5–15.5)
WBC: 12.7 10*3/uL — ABNORMAL HIGH (ref 4.0–10.5)

## 2011-12-08 LAB — RENAL FUNCTION PANEL
CO2: 25 mEq/L (ref 19–32)
Chloride: 93 mEq/L — ABNORMAL LOW (ref 96–112)
GFR calc Af Amer: 7 mL/min — ABNORMAL LOW (ref >90)
Glucose, Bld: 250 mg/dL — ABNORMAL HIGH (ref 70–99)
Phosphorus: 3.6 mg/dL (ref 2.3–4.6)
Potassium: 3.7 mEq/L (ref 3.5–5.1)
Sodium: 132 mEq/L — ABNORMAL LOW (ref 135–145)

## 2011-12-08 LAB — BLOOD GAS, ARTERIAL
Acid-Base Excess: 3 mmol/L — ABNORMAL HIGH (ref 0.0–2.0)
Bicarbonate: 28.4 mEq/L — ABNORMAL HIGH (ref 20.0–24.0)
TCO2: 30.1 mmol/L (ref 0–100)
pCO2 arterial: 55.3 mmHg — ABNORMAL HIGH (ref 35.0–45.0)
pH, Arterial: 7.33 — ABNORMAL LOW (ref 7.350–7.450)

## 2011-12-08 MED ORDER — ALBUMIN HUMAN 25 % IV SOLN
25.0000 g | Freq: Once | INTRAVENOUS | Status: AC
  Administered 2011-12-08: 25 g via INTRAVENOUS
  Filled 2011-12-08: qty 100

## 2011-12-08 MED ORDER — LEVALBUTEROL HCL 0.63 MG/3ML IN NEBU
0.6300 mg | INHALATION_SOLUTION | RESPIRATORY_TRACT | Status: DC | PRN
  Filled 2011-12-08: qty 3

## 2011-12-08 MED ORDER — BOOST / RESOURCE BREEZE PO LIQD: 1.0000 | Freq: Three times a day (TID) | ORAL | Status: DC

## 2011-12-08 MED ORDER — BOOST / RESOURCE BREEZE PO LIQD
1.0000 | Freq: Three times a day (TID) | ORAL | Status: DC
  Administered 2011-12-08 – 2011-12-11 (×7): 1 via ORAL

## 2011-12-08 MED ORDER — RENA-VITE PO TABS
1.0000 | ORAL_TABLET | Freq: Every day | ORAL | Status: DC
  Administered 2011-12-08: 1 via ORAL
  Filled 2011-12-08 (×5): qty 1

## 2011-12-08 MED ORDER — NEPRO/CARBSTEADY PO LIQD
237.0000 mL | Freq: Two times a day (BID) | ORAL | Status: DC
  Filled 2011-12-08 (×3): qty 237

## 2011-12-08 MED ORDER — LEVALBUTEROL HCL 0.63 MG/3ML IN NEBU
0.6300 mg | INHALATION_SOLUTION | Freq: Four times a day (QID) | RESPIRATORY_TRACT | Status: DC
  Administered 2011-12-08 – 2011-12-09 (×5): 0.63 mg via RESPIRATORY_TRACT
  Filled 2011-12-08 (×8): qty 3

## 2011-12-08 MED ORDER — ALBUMIN HUMAN 25 % IV SOLN
INTRAVENOUS | Status: AC
  Administered 2011-12-08: 25 g via INTRAVENOUS
  Filled 2011-12-08: qty 100

## 2011-12-08 MED ORDER — FAMOTIDINE 20 MG PO TABS
20.0000 mg | ORAL_TABLET | Freq: Every day | ORAL | Status: DC
  Administered 2011-12-09 – 2011-12-10 (×4): 20 mg via ORAL
  Filled 2011-12-08 (×5): qty 1

## 2011-12-08 MED ORDER — ENOXAPARIN SODIUM 30 MG/0.3ML ~~LOC~~ SOLN
30.0000 mg | SUBCUTANEOUS | Status: DC
  Filled 2011-12-08: qty 0.3

## 2011-12-08 NOTE — Progress Notes (Signed)
Utilization review completed.  

## 2011-12-08 NOTE — Progress Notes (Signed)
Removed bipap- order d/c

## 2011-12-08 NOTE — Progress Notes (Signed)
INITIAL ADULT NUTRITION ASSESSMENT Date: 12/08/2011   Time: 12:10 PM  Reason for Assessment: Consult for Poor PO intake  INTERVENTION:  Nepro PO BID to maximize oral intake, each supplement provides 425 kcal and 19 grams protein.  Recommend liberalize diet to regular to help improve PO intake.  DOCUMENTATION CODES Per approved criteria  -Non-severe (moderate) malnutrition in the context of acute illness or injury -Underweight    ASSESSMENT: Female 78 y.o.  Dx: SOB (shortness of breath); Painful thoracic 10 fracture; Kyphoplasty was cancelled due to small left sided pleural effusion found on CXR  Hx:  Past Medical History  Diagnosis Date  . Anemia   . Cystoid macular edema   . Dialysis patient   . Cataract     h/o  . Glaucoma   . Hypertension   . Renal insufficiency   . COPD (chronic obstructive pulmonary disease)     Past Surgical History  Procedure Date  . Abdominal hysterectomy   . Appendectomy   . Cataract extraction   . Arteriovenous graft placement   . Thrombectomy w/ embolectomy 01/16/2011    Procedure: THROMBECTOMY ARTERIOVENOUS GORE-TEX GRAFT;  Surgeon: Juleen China, MD;  Location: [facility] OR;  Service: Vascular;  Laterality: Left;  repair of left arm graft bleed and revision of left arm graft.  . Kyphosis surgery     multiple levels lower thoracic and lumbar spine    Related Meds:  Scheduled Meds:   . antiseptic oral rinse  15 mL Mouth Rinse BID  . dipyridamole-aspirin  1 capsule Oral Daily  . dorzolamide  1 drop Both Eyes BID  . enoxaparin (LOVENOX) injection  40 mg Subcutaneous Q24H  . Fluticasone-Salmeterol  1 puff Inhalation BID  . ipratropium  0.5 mg Nebulization Q6H  . latanoprost  1 drop Both Eyes QHS  . megestrol  400 mg Oral BID  . methylPREDNISolone (SOLU-MEDROL) injection  40 mg Intravenous Q12H  . midodrine  15 mg Oral 3 times weekly  . protein supplement  1 scoop Oral BID WC  . sodium chloride  3 mL Intravenous Q12H  . timolol  1 drop  Both Eyes BID  . DISCONTD: protein supplement  1 scoop Oral BID WC   Continuous Infusions:  PRN Meds:.acetaminophen, acetaminophen, iohexol, levalbuterol, ondansetron (ZOFRAN) IV, ondansetron   Ht: 5\' 3"  (160 cm)  Wt: 98 lb 9.6 oz (44.725 kg)  Ideal Wt: 52.3 kg % Ideal Wt: 86%  Usual Wt:  Wt Readings from Last 10 Encounters:  12/07/11 98 lb 9.6 oz (44.725 kg)  12/07/11 105 lb (47.628 kg)  12/02/11 105 lb (47.628 kg)  09/14/11 105 lb (47.628 kg)  02/15/11 110 lb (49.896 kg)   % Usual Wt: 93%  Body mass index is 17.47 kg/(m^2). Underweight  Food/Nutrition Related Hx: 7% weight loss in 3 months  Labs:  CMP     Component Value Date/Time   NA 137 12/07/2011 1215   K 3.7 12/07/2011 1215   CL 97 12/07/2011 1215   CO2 31 12/07/2011 1215   GLUCOSE 91 12/07/2011 1215   BUN 8 12/07/2011 1215   CREATININE 3.48* 12/07/2011 1215   CALCIUM 9.7 12/07/2011 1215   PROT 7.5 12/02/2011 0843   ALBUMIN 3.4* 12/02/2011 0843   AST 29 12/02/2011 0843   ALT 14 12/02/2011 0843   ALKPHOS 237* 12/02/2011 0843   BILITOT 0.4 12/02/2011 0843   GFRNONAA 11* 12/07/2011 1215   GFRAA 12* 12/07/2011 1215     Intake/Output Summary (Last 24 hours) at  12/08/11 1216 Last data filed at 12/08/11 0900  Gross per 24 hour  Intake      0 ml  Output      0 ml  Net      0 ml    Diet Order: Renal 60/70-2-2 with 1200 ml fluid restriction  Supplements/Tube Feeding:  Beneprotein 1 scoop BID  IVF:  None  Estimated Nutritional Needs:   Kcal: 1350-1550 Protein: 60-80 grams Fluid: 1.2 liters  Patient endorses weight loss, c/o poor appetite due to pain.  Patient is underweight with BMI=17.5.  Has been eating poorly for the past few months due to back pain.  Pt meets criteria for non-severe (moderate) malnutrition in the context of acute illness as evidenced by 7% weight loss in 3 months, intake < 75% of estimated energy requirement for > 7 days, and mild loss of muscle mass and body fat.   NUTRITION  DIAGNOSIS: -Inadequate oral intake (NI-2.1).  Status: Ongoing  RELATED TO: back pain causing poor appetite  AS EVIDENCED BY: 7% weight loss in 3 months, poor intake of meals  MONITORING/EVALUATION(Goals):  Goal:  Intake to meet >90% of estimated nutrition needs.  Monitor:  PO intake, labs, weight trend.  EDUCATION NEEDS: -Education not appropriate at this time   Joaquin Courts, RD, LDN, CNSC Pager# [phone] After Hours Pager# [phone] 12/08/2011, 12:10 PM

## 2011-12-08 NOTE — Progress Notes (Signed)
Pt was just brought from dialysis. Pt is a little confused. Pt has a bp of 93/59, hr-75, ox-95% on 2L, t-97.4. Pt was oriented to the unit as best as I could get her. Pt is in no apparent distress. Pt's dialysis site is still bleeding. Per RN from dialysis could not get pt to stop bleeding. RN willl continue to monitor pt. Family members to report to bedside.

## 2011-12-08 NOTE — Progress Notes (Addendum)
[facility] Progress Note [place] TEAM 1 - Stepdown/ICU TEAM   ANEKA DANTONA ZOX:[id number] DOB: 11/01/1919 DOA: 12/07/2011 PCP: Leo Grosser, MD  Brief narrative: 78 year old female with Hx of multiple compression fractures s/p KP's having ongoing pain that was affecting her appetite and ability to ambulate and possible breathing per family.  She was scheduled to have kyphoplasty procedure the day of her admission - procedure was canceled due to a small left pleural effusion.  Patient was noted to be completely nonfocal but arousable only to sternal rub with minimal eye opening and mumbling. Chest x-ray was done because of shortness of breath. It showed a small left-sided pleural effusion and the patient was sent to the ER for further evaluation, because of concern about respiratory distress with moderate sedation as well as cardiac event given the patient's age.  Family suggested the patient has been short of breath for 3 or 4 years, but had become worse over the last 3 weeks, subsequent to patient's T10 compression fracture.   Assessment/Plan:  Dyspnea / reported hx of COPD? CT of chest reveals bibasilar atelectasis vs/ infiltrates with pleural effusions greater on  the left - ABG at admission suggested a mild respiratory acidosis - the patient's baseline PCO2 is unclear - at present she appears to have improved clinically - her underlying pulmonary status is not clear but her x-rays do suggest that she likely suffers from chronic pulmonary disease not otherwise specified - perhaps she does suffer with COPD related to secondhand smoke - I will recheck a chest x-ray in the morning post dialysis and we will need to follow her respiratory status clinically - a transthoracic echocardiogram is pending to evaluate for congestive heart failure  T10 compression fracture Will not pursue intervention until it is clear the patient is clinically stable  End-stage renal disease on Monday  Wednesday Friday dialysis Nephrology has been contacted to continue the patient's usual dialysis schedule  Chronic anemia related to kidney disease Hemoglobin appears to be stable at this time  Macrocytosis I will check an anemia panel in the morning to assess her B12 and folic acid levels  Glaucoma  Hypertension Blood pressure is reasonably controlled at this time  History of multiple posterior circulation infarcts  Code Status: No CODE BLUE Disposition Plan: Stable for transfer to the renal floor  Consultants: Nephrology  Antibiotics: None  DVT prophylaxis: Lovenox  HPI/Subjective: Patient is alert and interactive.  The family reports that this is her baseline mental status.  She denies any complaints whatsoever.  She does appear to be mildly short of breath but not in extremis.   Objective: Blood pressure 141/65, pulse 76, temperature 97.5 F (36.4 C), temperature source Oral, resp. rate 24, height 5\' 3"  (1.6 m), weight 44.725 kg (98 lb 9.6 oz), SpO2 100.00%.  Intake/Output Summary (Last 24 hours) at 12/08/11 1230 Last data filed at 12/08/11 0900  Gross per 24 hour  Intake      0 ml  Output      0 ml  Net      0 ml     Exam: General: Only very mild respiratory distress at rest  Lungs: Distant breath sounds throughout with no active wheeze and no focal crackles - diminished breath sounds in bases bilaterally Cardiovascular: Regular rate and rhythm without murmur gallop or rub  Abdomen: Nontender, nondistended, soft, bowel sounds positive, no rebound, no ascites, no appreciable mass Extremities: No significant cyanosis, clubbing, or edema bilateral lower extremities  Data Reviewed:  Basic Metabolic Panel:  Lab 12/07/11 1478 12/07/11 0735 12/02/11 0843  NA 137 139 137  K 3.7 3.3* 3.2*  CL 97 97 92*  CO2 31 36* 33*  GLUCOSE 91 107* 87  BUN 8 7 8   CREATININE 3.48* 3.23* 3.02*  CALCIUM 9.7 9.9 9.6  MG -- -- --  PHOS -- -- --   Liver Function  Tests:  Lab 12/02/11 0843  AST 29  ALT 14  ALKPHOS 237*  BILITOT 0.4  PROT 7.5  ALBUMIN 3.4*   CBC:  Lab 12/07/11 2033 12/07/11 1215 12/07/11 0735 12/02/11 0843  WBC 10.9* 10.4 9.3 11.2*  NEUTROABS -- 7.9* 6.9 8.8*  HGB 12.4 12.1 12.5 11.5*  HCT 39.6 38.3 40.1 36.1  MCV 102.6* 102.1* 102.6* 102.3*  PLT 338 392 407* 372   Cardiac Enzymes:  Lab 12/07/11 1216  CKTOTAL --  CKMB --  CKMBINDEX --  TROPONINI <0.30    Recent Results (from the past 240 hour(s))  MRSA PCR SCREENING     Status: Normal   Collection Time   12/08/11 12:13 AM      Component Value Range Status Comment   MRSA by PCR NEGATIVE  NEGATIVE Final      Studies:  Recent x-ray studies have been reviewed in detail by the Attending Physician  Scheduled Meds:  Reviewed in detail by the Attending Physician   Lonia Blood, MD [facility] Office  [phone] Pager [phone]  On-Call/Text Page:      [email or web address]      password [email or web address]  If 7PM-7AM, please contact night-coverage [email or web address] Password [email or web address] 12/08/2011, 12:30 PM   LOS: 1 day

## 2011-12-08 NOTE — Progress Notes (Signed)
Report  Called to  3300 RN , vitals obtained and reported to  NP. Patient transferred to unit.  2nd set agas to be obtained when  Received on unit.

## 2011-12-08 NOTE — Progress Notes (Signed)
  Echocardiogram 2D Echocardiogram has been performed.  [name] 12/08/2011, 3:21 PM

## 2011-12-08 NOTE — Progress Notes (Signed)
Pt to HD.  After HD, pt to be transferred to [id number].  Son-in-law contacted and notified of pt's new room following HD.    Roselie Awkward, RN

## 2011-12-08 NOTE — Progress Notes (Signed)
Venous pressures running high when BFR increased, attempted to reposition needle and pressures remained high. New needle inserted for venous site.

## 2011-12-08 NOTE — Consult Note (Signed)
[facility] Renal Consultation Note  Indication for Consultation:  Management of ESRD/hemodialysis; anemia, hypertension/volume and secondary hyperparathyroidism  HPI: [name] is a 78 y.o. female admitted yesterday with progressive SOB with left pleural Effusion on ER CXR. By history , she has a history of thoracic spine compression fracture requiring kyphoplasty in the past. She was scheduled to have kyphoplasty procedure yesterday however the son-in-law  ( her POA)reports that patient has had some Progressive SOB this month and" a recent scheduled kyphoplasty procedure a week ago while receiving sedation medication patient stopped breathing, which required reversal of medication and cessation of the procedure." A Pleural Effusion was seen on MRI and cxr  In light of the new finding of left pleural effusion, her doctor would like patient to be admitted and monitor to rule out infection before repeating Kyphoplasty.  Now in room with cpap on denying any SOB.      Past Medical History  Diagnosis Date  . Anemia   . Cystoid macular edema   . Dialysis patient   . Cataract     h/o  . Glaucoma   . Hypertension   . Renal insufficiency   . COPD (chronic obstructive pulmonary disease)     Past Surgical History  Procedure Date  . Abdominal hysterectomy   . Appendectomy   . Cataract extraction   . Arteriovenous graft placement   . Thrombectomy w/ embolectomy 01/16/2011    Procedure: THROMBECTOMY ARTERIOVENOUS GORE-TEX GRAFT;  Surgeon: Juleen China, MD;  Location: [facility] OR;  Service: Vascular;  Laterality: Left;  repair of left arm graft bleed and revision of left arm graft.  . Kyphosis surgery     multiple levels lower thoracic and lumbar spine     No family history on file.   Social= Pt not able to give much info but by past history Lives with Daughter and Son-in -law,Nonsmoker,  "exposed to Passive smoke in past"No history of alcohol or illicit drugs.  No Known  Allergies  Prior to Admission medications   Medication Sig Start Date End Date Taking? Authorizing Provider  albuterol (PROVENTIL HFA;VENTOLIN HFA) 108 (90 BASE) MCG/ACT inhaler Inhale 2 puffs into the lungs 4 (four) times daily as needed. For shortness of breath.   Yes Historical Provider, MD  dipyridamole-aspirin (AGGRENOX) 25-200 MG per 12 hr capsule Take 1 capsule by mouth daily.    Yes Historical Provider, MD  dorzolamide (TRUSOPT) 2 % ophthalmic solution Place 1 drop into both eyes 2 (two) times daily.    Yes Historical Provider, MD  famotidine (PEPCID) 20 MG tablet Take 20 mg by mouth at bedtime.    Yes Historical Provider, MD  latanoprost (XALATAN) 0.005 % ophthalmic solution Place 1 drop into both eyes at bedtime.    Yes Historical Provider, MD  megestrol (MEGACE) 40 MG/ML suspension Take 400 mg by mouth 2 (two) times daily.    Yes Historical Provider, MD  midodrine (PROAMATINE) 10 MG tablet Take 15 mg by mouth 3 (three) times a week. Takes 1 tablet prior to each HD treatments on Monday Wednesday and friday   Yes Historical Provider, MD  multivitamin (RENA-VIT) TABS tablet Take 1 tablet by mouth daily.   Yes Historical Provider, MD  protein supplement (RESOURCE BENEPROTEIN) 6 g POWD Take 1 scoop by mouth 2 (two) times daily with a meal.    Yes Historical Provider, MD  timolol (BETIMOL) 0.5 % ophthalmic solution Place 1 drop into both eyes 2 (two) times daily.  Yes Historical Provider, MD     Scheduled:   . antiseptic oral rinse  15 mL Mouth Rinse BID  . dipyridamole-aspirin  1 capsule Oral Daily  . dorzolamide  1 drop Both Eyes BID  . enoxaparin (LOVENOX) injection  40 mg Subcutaneous Q24H  . famotidine  20 mg Oral QHS  . feeding supplement (NEPRO CARB STEADY)  237 mL Oral BID BM  . ipratropium  0.5 mg Nebulization Q6H  . latanoprost  1 drop Both Eyes QHS  . levalbuterol  0.63 mg Nebulization Q6H  . megestrol  400 mg Oral BID  . midodrine  15 mg Oral 3 times weekly  .  multivitamin  1 tablet Oral QHS  . protein supplement  1 scoop Oral BID WC  . timolol  1 drop Both Eyes BID  . DISCONTD: Fluticasone-Salmeterol  1 puff Inhalation BID  . DISCONTD: methylPREDNISolone (SOLU-MEDROL) injection  40 mg Intravenous Q12H  . DISCONTD: protein supplement  1 scoop Oral BID WC  . DISCONTD: sodium chloride  3 mL Intravenous Q12H   ZOX:[id number], acetaminophen, iohexol, levalbuterol, ondansetron (ZOFRAN) IV, ondansetron, DISCONTD: levalbuterol Anti-infectives    None      Results for orders placed during the hospital encounter of 12/07/11 (from the past 48 hour(s))  CBC WITH DIFFERENTIAL     Status: Abnormal   Collection Time   12/07/11 12:15 PM      Component Value Range Comment   WBC 10.4  4.0 - 10.5 K/uL    RBC 3.75 (*) 3.87 - 5.11 MIL/uL    Hemoglobin 12.1  12.0 - 15.0 g/dL    HCT 14.7  82.9 - 56.2 %    MCV 102.1 (*) 78.0 - 100.0 fL    MCH 32.3  26.0 - 34.0 pg    MCHC 31.6  30.0 - 36.0 g/dL    RDW 13.0  86.5 - 78.4 %    Platelets 392  150 - 400 K/uL    Neutrophils Relative 76  43 - 77 %    Neutro Abs 7.9 (*) 1.7 - 7.7 K/uL    Lymphocytes Relative 14  12 - 46 %    Lymphs Abs 1.4  0.7 - 4.0 K/uL    Monocytes Relative 7  3 - 12 %    Monocytes Absolute 0.8  0.1 - 1.0 K/uL    Eosinophils Relative 3  0 - 5 %    Eosinophils Absolute 0.3  0.0 - 0.7 K/uL    Basophils Relative 0  0 - 1 %    Basophils Absolute 0.0  0.0 - 0.1 K/uL   BASIC METABOLIC PANEL     Status: Abnormal   Collection Time   12/07/11 12:15 PM      Component Value Range Comment   Sodium 137  135 - 145 mEq/L    Potassium 3.7  3.5 - 5.1 mEq/L    Chloride 97  96 - 112 mEq/L    CO2 31  19 - 32 mEq/L    Glucose, Bld 91  70 - 99 mg/dL    BUN 8  6 - 23 mg/dL    Creatinine, Ser 6.96 (*) 0.50 - 1.10 mg/dL    Calcium 9.7  8.4 - 29.5 mg/dL    GFR calc non Af Amer 11 (*) >90 mL/min    GFR calc Af Amer 12 (*) >90 mL/min   TROPONIN I     Status: Normal   Collection Time   12/07/11 12:16 PM  Component Value Range Comment   Troponin I <0.30  <0.30 ng/mL   CBC     Status: Abnormal   Collection Time   12/07/11  8:33 PM      Component Value Range Comment   WBC 10.9 (*) 4.0 - 10.5 K/uL    RBC 3.86 (*) 3.87 - 5.11 MIL/uL    Hemoglobin 12.4  12.0 - 15.0 g/dL    HCT 16.1  09.6 - 04.5 %    MCV 102.6 (*) 78.0 - 100.0 fL    MCH 32.1  26.0 - 34.0 pg    MCHC 31.3  30.0 - 36.0 g/dL    RDW 40.9  81.1 - 91.4 %    Platelets 338  150 - 400 K/uL   BLOOD GAS, ARTERIAL     Status: Abnormal   Collection Time   12/07/11  8:35 PM      Component Value Range Comment   O2 Content 2.0      Delivery systems NASAL CANNULA      pH, Arterial 7.311 (*) 7.350 - 7.450    pCO2 arterial 60.6 (*) 35.0 - 45.0 mmHg    pO2, Arterial 74.7 (*) 80.0 - 100.0 mmHg    Bicarbonate 29.7 (*) 20.0 - 24.0 mEq/L    TCO2 31.6  0 - 100 mmol/L    Acid-Base Excess 3.9 (*) 0.0 - 2.0 mmol/L    O2 Saturation 96.0      Patient temperature 98.6      Collection site RIGHT RADIAL      Drawn by [phone]      Sample type ARTERIAL      Allens test (pass/fail) PASS  PASS   MRSA PCR SCREENING     Status: Normal   Collection Time   12/08/11 12:13 AM      Component Value Range Comment   MRSA by PCR NEGATIVE  NEGATIVE   BLOOD GAS, ARTERIAL     Status: Abnormal   Collection Time   12/08/11  1:37 AM      Component Value Range Comment   FIO2 0.40      Delivery systems BILEVEL POSITIVE AIRWAY PRESSURE      Inspiratory PAP 14      Expiratory PAP 5      pH, Arterial 7.330 (*) 7.350 - 7.450    pCO2 arterial 55.3 (*) 35.0 - 45.0 mmHg    pO2, Arterial 142.0 (*) 80.0 - 100.0 mmHg    Bicarbonate 28.4 (*) 20.0 - 24.0 mEq/L    TCO2 30.1  0 - 100 mmol/L    Acid-Base Excess 3.0 (*) 0.0 - 2.0 mmol/L    O2 Saturation 99.3      Patient temperature 98.6      Collection site RIGHT RADIAL      Drawn by [id number]      Sample type ARTERIAL DRAW      Allens test (pass/fail) PASS  PASS   .  ROS: Pt. Unable to give History, but by history no  reported fevers , chills, sweats,nausea, vomiting  Physical Exam: Filed Vitals:   12/08/11 1212  BP:   Pulse:   Temp: 97.5 F (36.4 C)  Resp:      General:Thin frail elderly BF, co  back pain NAD HEENT: [name] , MMM Eyes: eomi Neck: no jvd Heart: RRR, no murmur or rub heard Lungs: Decreased BS at bases, other clear with poor respiratory  effort Abdomen: bs +=, soft, nontender Extremities: no pedal edema Skin:abrsiomn left lower  leg, no rash or overt ulcers  Neuro:lethargic  But awake/ oriented to [place], person, not date,moves all extremities to command Dialysis Access: positive bruit left upper arm avgg  Dialysis Orders: Center: [facility] on mwf . EDW 45kg HD Bath 2.ok, 2.25 ca  Time 4hrs Heparin 5ml. Access LUA  AVGG  BFR 400 DFR 800    Zemplar 0 mcg IV/HD Epogen 0   Units IV/HD  Venofer  0  Other 0  Assessment/Plan 1. Respiratory Failure with Hypercapnea with COPD in 40yr with small left pleural effusion=  Admit team managing on BI-PAP eval. With 2d echo pending today/ Inhalers/hd today also 2. Acute on chronic Compression Fracture of T10/ chronic  Compression Fractures T5,T7,T12= For Kyphoplasty when stable with Radiology 3. ESRD -  HD  [facility]  MWF/ use no heparin hd  In anticipation of procedure 4. Hypertension/volume  - on Midodrine 15mg  tid/  bp range since admit=101/57 to 141/65  /hd today / ???2 wts today 44.725 and 45.09  attempt 1 to 2 kgs  uf with cxr showing pleural effusions  / her edw was 45.0 kg as out pt. center 5. Anemia  - hgb= 12.4/ no epo or iron at kid. center 6. Metabolic bone disease -  9.7 CA/ no zemplar/ no out pt. binders 7. Nutrition - alb 3.5 last at outpt  Hd  On 11/18/11= use Breeze supplement/ on Megace 8.   Noted  Patient is DNR  Lenny Pastel, PA-C [facility] Beeper [phone] 12/08/2011, 12:42 PM   Patient seen and examined and agree with assessment and plan as above.  Vinson Moselle  MD [employer] [phone] pgr    [phone] cell 12/08/2011, 2:32 PM

## 2011-12-09 ENCOUNTER — Inpatient Hospital Stay (HOSPITAL_COMMUNITY): Payer: Medicare Other

## 2011-12-09 ENCOUNTER — Encounter (HOSPITAL_COMMUNITY): Payer: Self-pay | Admitting: Radiology

## 2011-12-09 DIAGNOSIS — R0602 Shortness of breath: Secondary | ICD-10-CM

## 2011-12-09 DIAGNOSIS — I10 Essential (primary) hypertension: Secondary | ICD-10-CM

## 2011-12-09 DIAGNOSIS — J90 Pleural effusion, not elsewhere classified: Principal | ICD-10-CM

## 2011-12-09 DIAGNOSIS — N186 End stage renal disease: Secondary | ICD-10-CM

## 2011-12-09 LAB — FOLATE: Folate: >20 ng/mL

## 2011-12-09 LAB — VITAMIN B12: Vitamin B-12: 1138 pg/mL — ABNORMAL HIGH (ref 211–911)

## 2011-12-09 LAB — IRON AND TIBC
Iron: 40 ug/dL — ABNORMAL LOW (ref 42–135)
TIBC: 176 ug/dL — ABNORMAL LOW (ref 250–470)

## 2011-12-09 MED ORDER — MIDODRINE HCL 5 MG PO TABS: 10.0000 mg | ORAL_TABLET | Freq: Two times a day (BID) | ORAL | Status: DC

## 2011-12-09 MED ORDER — ENOXAPARIN SODIUM 30 MG/0.3ML ~~LOC~~ SOLN
30.0000 mg | SUBCUTANEOUS | Status: DC
  Filled 2011-12-09 (×3): qty 0.3

## 2011-12-09 MED ORDER — MIDODRINE HCL 5 MG PO TABS
10.0000 mg | ORAL_TABLET | Freq: Two times a day (BID) | ORAL | Status: DC
  Administered 2011-12-09 – 2011-12-11 (×4): 10 mg via ORAL
  Filled 2011-12-09 (×6): qty 2

## 2011-12-09 MED ORDER — SODIUM CHLORIDE 0.9 % IV BOLUS (SEPSIS)
500.0000 mL | Freq: Once | INTRAVENOUS | Status: AC
  Administered 2011-12-09: 500 mL via INTRAVENOUS

## 2011-12-09 MED ORDER — HEPARIN SODIUM (PORCINE) 1000 UNIT/ML DIALYSIS
5000.0000 [IU] | INTRAMUSCULAR | Status: DC | PRN
  Filled 2011-12-09: qty 5

## 2011-12-09 MED ORDER — CEFAZOLIN SODIUM 1-5 GM-% IV SOLN
1.0000 g | Freq: Once | INTRAVENOUS | Status: AC
  Administered 2011-12-10: 1 g via INTRAVENOUS
  Filled 2011-12-09: qty 50

## 2011-12-09 NOTE — Evaluation (Signed)
Physical Therapy Evaluation Patient Details Name: [name] MRN: [id number] DOB: 07-Jun-1919 Today's Date: 12/09/2011 Time: 7829-5621 PT Time Calculation (min): 14 min  PT Assessment / Plan / Recommendation Clinical Impression  Pt admitted for SOB with Left pleural effusion and awaiting kyphoplasty. Spoke with son-in-law Jake Shark who confirmed that pt has been total assist for mobility and ADLs at home and during evaluation pt presented in the same manner. Explained to son-in-law role of acute therapy and he was concerned regarding mobility without kyphoplasty but given history of mulitiple kyphoplasty and prolonged total care at home do not feel there will be significant change in mobility after procedure as compared to today. Recommend daily mobility with nursing staff and RN Delcine advised of stand pivot performed. No further therapy needs . Pt may beneifit from hospital bed to ease burden of care at home.     PT Assessment  Patent does not need any further PT services    Follow Up Recommendations  No PT follow up;Supervision/Assistance - 24 hour    Does the patient have the potential to tolerate intense rehabilitation      Barriers to Discharge        Equipment Recommendations  Hospital bed    Recommendations for Other Services     Frequency      Precautions / Restrictions Precautions Precautions: Fall   Pertinent Vitals/Pain Pt reported some back pain did not rate based on face 2/10, repositioned      Mobility  Bed Mobility Bed Mobility: Rolling Left;Left Sidelying to Sit;Sitting - Scoot to [place] of Bed Rolling Left: 2: Max assist Left Sidelying to Sit: 1: +1 Total assist Sitting - Scoot to Edge of Bed: 2: Max assist Details for Bed Mobility Assistance: assist to bring bil LE off bed and elevate trunk with cueing Transfers Transfers: Sit to Stand;Stand to Sit;Stand Pivot Transfers Sit to Stand: 2: Max assist;From bed Stand to Sit: 2: Max assist;To  chair/3-in-1 Stand Pivot Transfers: 1: +1 Total assist Details for Transfer Assistance: max cueing for anterior weight shift and control to descent with assist for anterior translation, balance and pivoting to chair Ambulation/Gait Ambulation/Gait Assistance: Not tested (comment) (pt nonambulatory at least a year)    Shoulder Instructions     Exercises     PT Diagnosis:    PT Problem List:   PT Treatment Interventions:     PT Goals    Visit Information  Last PT Received On: 12/09/11 Assistance Needed: +1 PT/OT Co-Evaluation/Treatment: Yes    Subjective Data  Subjective: "Raeli" "a man usually helps me" Patient Stated Goal: none stated agreeable to get to chair like transfers at home   Prior Functioning  Home Living Lives With: Family Available Help at Discharge: Family;Available 24 hours/day Type of Home: House Home Adaptive Equipment: Wheelchair - manual Prior Function Level of Independence: Needs assistance Needs Assistance: Light Housekeeping;Meal Prep;Transfers;Bathing;Dressing;Toileting Bath: Total Dressing: Total Toileting:  (HD and lack of urine, ostomy for stool) Meal Prep: Total Light Housekeeping: Total Transfer Assistance: total assist to pivot to [facility] for all mobility- per pt and son-in-law Jake Shark Able to Take Stairs?: No Driving: No Vocation: Retired Musician: Clinical cytogeneticist  Overall Cognitive Status: Impaired Arousal/Alertness: Awake/alert Orientation Level: Disoriented to;Time;Place Behavior During Session: Flat affect    Extremity/Trunk Assessment Right Lower Extremity Assessment RLE ROM/Strength/Tone: Deficits;Unable to fully assess;Due to impaired cognition RLE ROM/Strength/Tone Deficits: grossly 2-/5 Left Lower Extremity Assessment LLE ROM/Strength/Tone: Deficits;Unable to fully assess;Due to impaired cognition LLE ROM/Strength/Tone  Deficits: grossly 2-/5 Trunk Assessment Trunk Assessment: Kyphotic   Balance  Balance Balance Assessed: Yes Static Sitting Balance Static Sitting - Balance Support: Bilateral upper extremity supported;Feet supported Static Sitting - Level of Assistance: 5: Stand by assistance Static Sitting - Comment/# of Minutes: 3  End of Session PT - End of Session Activity Tolerance: Patient tolerated treatment well Patient left: in chair;with call bell/phone within reach Nurse Communication: Mobility status  GP     Toney Sang Beth 12/09/2011, 12:23 PM  Delaney Meigs, PT [phone]

## 2011-12-09 NOTE — Plan of Care (Signed)
Problem: Phase I Progression Outcomes Goal: Initial discharge plan identified Outcome: Completed/Met Date Met:  12/09/11 To return home with family

## 2011-12-09 NOTE — Evaluation (Signed)
Occupational Therapy Evaluation Patient Details Name: [name] MRN: [id number] DOB: 04/19/1919 Today's Date: 12/09/2011 Time: 7829-5621 OT Time Calculation (min): 14 min  OT Assessment / Plan / Recommendation Clinical Impression    Pt admitted for SOB with Left pleural effusion and awaiting kyphoplasty. Pt total assist with ADLs (except independent with feeding) at baseline and today continues to present as total assist.  No acute OT need at this time.  Will sign off and recommend continued 24/7 assist at discharge.    OT Assessment  Patient does not need any further OT services    Follow Up Recommendations  No OT follow up;Supervision/Assistance - 24 hour    Barriers to Discharge      Equipment Recommendations  Hospital bed    Recommendations for Other Services    Frequency       Precautions / Restrictions Precautions Precautions: Fall   Pertinent Vitals/Pain See vitals    ADL  Eating/Feeding: Performed;Supervision/safety Where Assessed - Eating/Feeding: Chair Upper Body Dressing: Performed;+1 Total assistance Where Assessed - Upper Body Dressing: Supported sitting Lower Body Dressing: Performed;+1 Total assistance Where Assessed - Lower Body Dressing: Supported sitting Toilet Transfer: Simulated;+1 Total assistance Toilet Transfer Method: Surveyor, minerals:  (bed to chair) Equipment Used: Gait belt Transfers/Ambulation Related to ADLs: +1 total assist for SPT ADL Comments: Pt is at baseline     OT Diagnosis:    OT Problem List:   OT Treatment Interventions:     OT Goals    Visit Information  Last OT Received On: 12/09/11 Assistance Needed: +1    Subjective Data      Prior Functioning     Home Living Lives With: Family Available Help at Discharge: Family;Available 24 hours/day Type of Home: House Home Adaptive Equipment: Wheelchair - manual Prior Function Level of Independence: Needs assistance Needs Assistance: Light  Housekeeping;Meal Prep;Transfers;Bathing;Dressing;Toileting Bath: Total Dressing: Total Toileting:  (HD and lack of urine, ostomy for stool) Meal Prep: Total Light Housekeeping: Total Transfer Assistance: total assist to pivot to [facility] for all mobility- per pt and son-in-law Jake Shark Able to Take Stairs?: No Driving: No Vocation: Retired Comments: PT Loss adjuster, chartered) spoke with son in law Jake Shark who confirmed that pt was total assist at home at baseline. Communication Communication: HOH         Vision/Perception     Cognition  Overall Cognitive Status: Impaired Arousal/Alertness: Awake/alert Orientation Level: Disoriented to;Time;Place Behavior During Session: Flat affect    Extremity/Trunk Assessment Right Upper Extremity Assessment RUE ROM/Strength/Tone: Unable to fully assess;Due to impaired cognition (at least 3/5 throughout) Left Upper Extremity Assessment LUE ROM/Strength/Tone: Unable to fully assess;Due to impaired cognition (at least 3/5 throughout)     Mobility Bed Mobility Bed Mobility: Rolling Left;Left Sidelying to Sit;Sitting - Scoot to [place] of Bed Rolling Left: 2: Max assist Left Sidelying to Sit: 1: +1 Total assist Sitting - Scoot to Edge of Bed: 2: Max assist Details for Bed Mobility Assistance: assist to bring bil LE off bed and elevate trunk with cueing Transfers Transfers: Sit to Stand;Stand to Sit Sit to Stand: 2: Max assist;From bed Stand to Sit: 2: Max assist;To chair/3-in-1 Details for Transfer Assistance: max cueing for anterior weight shift and control to descent with assist for anterior translation, balance and pivoting to chair     Shoulder Instructions     Exercise     Balance Balance Balance Assessed: Yes Static Sitting Balance Static Sitting - Balance Support: Bilateral upper extremity supported;Feet supported Static Sitting -  Level of Assistance: 5: Stand by assistance Static Sitting - Comment/# of Minutes: ~3 minutes   End of Session OT - End  of Session Equipment Utilized During Treatment: Gait belt Activity Tolerance: Patient tolerated treatment well Patient left: in chair;with call bell/phone within reach Nurse Communication: Mobility status  GO    12/09/2011 Cipriano Mile OTR/L Pager [phone] Office [phone]  Cipriano Mile 12/09/2011, 5:24 PM

## 2011-12-09 NOTE — Progress Notes (Signed)
[facility] Progress Note    [name]:[id number] DOB: Jul 25, 1919 DOA: 12/07/2011 PCP: Lynnea Ferrier TOM, MD   Assessment/Plan:  Dyspnea with pleural effusion.  likely multifactorial: diastolic dysfunction; possible infiltrates on CT Chest; chronic lung disease with possible COPD; ESRD with variable volume status.  Ms.  Shults no longer has dyspnea.  Her breathing is relaxed.  CT of chest reveals bibasilar atelectasis vs/ infiltrates with pleural effusions greater on the left - ABG at admission suggested a mild respiratory acidosis - the patient's baseline PCO2 is unclear - at present she appears to have improved clinically - her underlying pulmonary status is not clear but her x-rays do suggest that she likely suffers from chronic pulmonary disease not otherwise specified - perhaps she does suffer with COPD related to secondhand smoke.  Further her 2d echo completed this hospitalization indicated an EF of 60 - 65%.  She may have a component of diastolic CHF.  T10 compression fracture Will move forward with Kyphoplasty on 12/10/2011 via Interventional Radiology.  Re-assess in am to be certain she is able to tolerate the procedure.  End-stage renal disease on Monday Wednesday Friday dialysis Nephrology Consulted.  Continue the patient's usual dialysis schedule (M/W/F)  Chronic anemia related to kidney disease Hemoglobin appears to be stable at this time  Glaucoma  Hypotension Blood pressure low.  On midodrine prior to dialysis.  After discussing with Renal will change midodrine to 10 mg bid and give NS bolus of 500 ml x 1.  Leukocytosis. WBC noted to be trending up slightly on 12/09/11.  Will monitor.  History of multiple posterior circulation infarcts  Code Status: No CODE BLUE Disposition Plan: Stable for transfer to the renal floor  Consultants: Nephrology  Antibiotics: None  DVT prophylaxis: Lovenox   Brief narrative: 78 year old female with Hx  of multiple compression fractures s/p KP's having ongoing pain that was affecting her appetite and ability to ambulate and possible breathing per family.  She was scheduled to have kyphoplasty procedure the day of her admission - procedure was canceled due to a small left pleural effusion.  Patient was noted to be completely nonfocal but arousable only to sternal rub with minimal eye opening and mumbling. Chest x-ray was done because of shortness of breath. It showed a small left-sided pleural effusion and the patient was sent to the ER for further evaluation, because of concern about respiratory distress with moderate sedation as well as cardiac event given the patient's age.  Family suggested the patient has been short of breath for 3 or 4 years, but had become worse over the last 3 weeks, subsequent to patient's T10 compression fracture.    HPI/Subjective: Patient is alert and interactive.   She denies any complaints.     Objective: Blood pressure 85/44, pulse 99, temperature 97.4 F (36.3 C), temperature source Oral, resp. rate 32, height 5\' 3"  (1.6 m), weight 42.5 kg (93 lb 11.1 oz), SpO2 96.00%.  Intake/Output Summary (Last 24 hours) at 12/09/11 1325 Last data filed at 12/09/11 0139  Gross per 24 hour  Intake    360 ml  Output    638 ml  Net   -278 ml     Exam: General: Awakened from sleep.  Comfortable. Mildly confused. Lungs: breath sounds throughout with no active wheeze and no focal crackles - diminished breath sounds in bases bilaterally Cardiovascular: Regular rate and rhythm without murmur gallop or rub  Abdomen: thin Nontender, nondistended, soft, bowel sounds positive, no rebound, no ascites, no  appreciable mass Extremities: No significant cyanosis, clubbing, or edema bilateral lower extremities Skin:  Thin, multiple bruises on her arms. Neuro:  Patient awakened from sleep but orientated only to person.  [place]  Data Reviewed: Basic  Metabolic Panel:  Lab 12/08/11 4098 12/07/11 1215 12/07/11 0735  NA 132* 137 139  K 3.7 3.7 3.3*  CL 93* 97 97  CO2 25 31 36*  GLUCOSE 250* 91 107*  BUN 19 8 7   CREATININE 5.26* 3.48* 3.23*  CALCIUM 9.0 9.7 9.9  MG -- -- --  PHOS 3.6 -- --   Liver Function Tests:  Lab 12/08/11 1703  AST --  ALT --  ALKPHOS --  BILITOT --  PROT --  ALBUMIN 3.0*   CBC:  Lab 12/08/11 1703 12/07/11 2033 12/07/11 1215 12/07/11 0735  WBC 12.7* 10.9* 10.4 9.3  NEUTROABS -- -- 7.9* 6.9  HGB 10.7* 12.4 12.1 12.5  HCT 33.9* 39.6 38.3 40.1  MCV 100.3* 102.6* 102.1* 102.6*  PLT 461* 338 392 407*   Cardiac Enzymes:  Lab 12/07/11 1216  CKTOTAL --  CKMB --  CKMBINDEX --  TROPONINI <0.30    Recent Results (from the past 240 hour(s))  MRSA PCR SCREENING     Status: Normal   Collection Time   12/08/11 12:13 AM      Component Value Range Status Comment   MRSA by PCR NEGATIVE  NEGATIVE Final      Studies: 2D Echocardiogram 12/09/2011 Study Conclusions  - Left ventricle: The cavity size was normal. Wall thickness was normal. Systolic function was normal. The estimated ejection fraction was in the range of 60% to 65%. There was an increased relative contribution of atrial contraction to ventricular filling.  - Aortic valve: Mildly calcified annulus.   Scheduled Meds:  Reviewed in detail by the Attending Physician   Algis Downs, PA-C [facility] Pager: [phone]   LOS: 2 days      Attending -seen and examined, agree with the above assessment and plan. Worsening leukocytosis-no clear cut foci of infection, CT chest showing no real PNA-more atelectasis, monitor of Antibiotics, if febrile will start antibiotics and obtain blood cultures. 2kg below dry weight-BP soft-had HD yesterday-will bolus 500 cc and see if BP responds.  Windell Norfolk MD

## 2011-12-09 NOTE — Progress Notes (Signed)
Subjective: no complaints, up in chair, denies SOB or cough, no CP  Objective Vital signs in last 24 hours: Filed Vitals:   12/08/11 2218 12/09/11 0155 12/09/11 0546 12/09/11 0844  BP: 93/59  85/44   Pulse: 85  99   Temp: 97.4 F (36.3 C)  97.4 F (36.3 C)   TempSrc: Oral  Oral   Resp: 25  32   Height:      Weight:      SpO2: 100% 99%  96%   Weight change: -1.89 kg (-4 lb 2.7 oz)  Intake/Output Summary (Last 24 hours) at 12/09/11 1426 Last data filed at 12/09/11 0139  Gross per 24 hour  Intake    360 ml  Output    638 ml  Net   -278 ml   Labs: Basic Metabolic Panel:  Lab 12/08/11 1610 12/07/11 1215 12/07/11 0735  NA 132* 137 139  K 3.7 3.7 3.3*  CL 93* 97 97  CO2 25 31 36*  GLUCOSE 250* 91 107*  BUN 19 8 7   CREATININE 5.26* 3.48* 3.23*  ALB -- -- --  CALCIUM 9.0 9.7 9.9  PHOS 3.6 -- --   Liver Function Tests:  Lab 12/08/11 1703  AST --  ALT --  ALKPHOS --  BILITOT --  PROT --  ALBUMIN 3.0*   No results found for this basename: LIPASE:3,AMYLASE:3 in the last 168 hours No results found for this basename: AMMONIA:3 in the last 168 hours CBC:  Lab 12/08/11 1703 12/07/11 2033 12/07/11 1215 12/07/11 0735  WBC 12.7* 10.9* 10.4 9.3  NEUTROABS -- -- 7.9* 6.9  HGB 10.7* 12.4 12.1 12.5  HCT 33.9* 39.6 38.3 40.1  MCV 100.3* 102.6* 102.1* 102.6*  PLT 461* 338 392 407*   PT/INR: @labrcntip (inr:5) Cardiac Enzymes:  Lab 12/07/11 1216  CKTOTAL --  CKMB --  CKMBINDEX --  TROPONINI <0.30   CBG: No results found for this basename: GLUCAP:5 in the last 168 hours  Iron Studies:  Lab 12/09/11 0623  IRON 40*  TIBC 176*  TRANSFERRIN --  FERRITIN 1134*    Physical Exam:  Blood pressure 85/44, pulse 99, temperature 97.4 F (36.3 C), temperature source Oral, resp. rate 32, height 5\' 3"  (1.6 m), weight 42.5 kg (93 lb 11.1 oz), SpO2 96.00%.  RUE:AVWU frail elderly BF, co back pain NAD Neck: no jvd  Heart: RRR, no murmur or rub heard  Lungs: Decreased BS at  bases, other clear with poor air movement Abdomen: bs +=, soft, nontender  Extremities: no pedal edema  Skin:abrsiomn left lower leg, no rash or overt ulcers  Neuro:lethargic But awake/ oriented to [place], person, not date,moves all extremities to command  Dialysis Access: positive bruit left upper arm avgg, multiple ecchymoses on extremities  Dialysis Orders: Center: [facility] on mwf .  EDW 45kg HD Bath 2.ok, 2.25 ca Time 4hrs Heparin 5ml. Access LUA AVGG BFR 400 DFR 800  Zemplar 0 mcg IV/HD Epogen 0 Units IV/HD Venofer 0 Other 0   Assessment/Plan  1. Acute on chronic resp failure in setting of severe COPD/chronic CO2 retention;  small effusions by chest CT, no vol overload or pulm edema. Prob due to underlying pulm disease. 2. Acute on chronic back pain- long hx multiple comp fractures of spine, s/p vertebloplasties; for another procedure if can be stabilized medically 3. CKD, cont hd mwf. No hep for procedure 4. Hypertension/volume - was on Midodrine 15mg  three times per week, changing to 10 mg bid today. Discussed with [facility].  2.5kg below dry weight and BP very low; give NS bolus, no UF with HD tomorrow unless BP better.  5. Anemia - hgb= 12.4/ no ep[facility] or [facility] 6. Metabolic bone disease - 9.7 CA/ no zemplar/ no out pt. binders 7. Nutrition - alb 3.5 last at outpt Hd On 11/18/11= use Breeze supplement/ on Megace       8. Noted Patient is DNR   Shelley Moselle  MD [facility] [phone] pgr    [phone] cell 12/09/2011, 2:26 PM

## 2011-12-09 NOTE — H&P (Signed)
[name] is an 78 y.o. female.   Chief Complaint: Back pain Pt was scheduled for OP T10 fx kyphoplasty Was admitted before procedure to be evaluated for chest congestion/ wheezing B small effusions; atelectasis - much improved Ok for procedure per Dr Jerral Ralph Able to have PT eval; not ambulating but can stand and transfer Will move forward with KP 10/11 as per Dr Jerral Ralph Check BP and labs in am HPI: Back pain; long hx of spinal fxs - augmentations; ESRD; HTN; glaucoma; COPD  Past Medical History  Diagnosis Date  . Anemia   . Cystoid macular edema   . Dialysis patient   . Cataract     h/o  . Glaucoma   . Hypertension   . Renal insufficiency   . COPD (chronic obstructive pulmonary disease)     Past Surgical History  Procedure Date  . Abdominal hysterectomy   . Appendectomy   . Cataract extraction   . Arteriovenous graft placement   . Thrombectomy w/ embolectomy 01/16/2011    Procedure: THROMBECTOMY ARTERIOVENOUS GORE-TEX GRAFT;  Surgeon: Juleen China, MD;  Location: [facility] OR;  Service: Vascular;  Laterality: Left;  repair of left arm graft bleed and revision of left arm graft.  . Kyphosis surgery     multiple levels lower thoracic and lumbar spine    No family history on file. Social History:  reports that she has never smoked. She does not have any smokeless tobacco history on file. She reports that she does not drink alcohol or use illicit drugs.  Allergies: No Known Allergies  Medications Prior to Admission  Medication Sig Dispense Refill  . albuterol (PROVENTIL HFA;VENTOLIN HFA) 108 (90 BASE) MCG/ACT inhaler Inhale 2 puffs into the lungs 4 (four) times daily as needed. For shortness of breath.      . dipyridamole-aspirin (AGGRENOX) 25-200 MG per 12 hr capsule Take 1 capsule by mouth daily.       . dorzolamide (TRUSOPT) 2 % ophthalmic solution Place 1 drop into both eyes 2 (two) times daily.       . famotidine (PEPCID) 20 MG tablet Take 20 mg by mouth at  bedtime.       Marland Kitchen latanoprost (XALATAN) 0.005 % ophthalmic solution Place 1 drop into both eyes at bedtime.       . megestrol (MEGACE) 40 MG/ML suspension Take 400 mg by mouth 2 (two) times daily.       . midodrine (PROAMATINE) 10 MG tablet Take 15 mg by mouth 3 (three) times a week. Takes 1 tablet prior to each HD treatments on Monday Wednesday and friday      . multivitamin (RENA-VIT) TABS tablet Take 1 tablet by mouth daily.      . protein supplement (RESOURCE BENEPROTEIN) 6 g POWD Take 1 scoop by mouth 2 (two) times daily with a meal.       . timolol (BETIMOL) 0.5 % ophthalmic solution Place 1 drop into both eyes 2 (two) times daily.         Results for orders placed during the hospital encounter of 12/07/11 (from the past 48 hour(s))  CBC     Status: Abnormal   Collection Time   12/07/11  8:33 PM      Component Value Range Comment   WBC 10.9 (*) 4.0 - 10.5 K/uL    RBC 3.86 (*) 3.87 - 5.11 MIL/uL    Hemoglobin 12.4  12.0 - 15.0 g/dL    HCT 81.1  91.4 - 78.2 %  MCV 102.6 (*) 78.0 - 100.0 fL    MCH 32.1  26.0 - 34.0 pg    MCHC 31.3  30.0 - 36.0 g/dL    RDW 16.1  09.6 - 04.5 %    Platelets 338  150 - 400 K/uL   BLOOD GAS, ARTERIAL     Status: Abnormal   Collection Time   12/07/11  8:35 PM      Component Value Range Comment   O2 Content 2.0      Delivery systems NASAL CANNULA      pH, Arterial 7.311 (*) 7.350 - 7.450    pCO2 arterial 60.6 (*) 35.0 - 45.0 mmHg    pO2, Arterial 74.7 (*) 80.0 - 100.0 mmHg    Bicarbonate 29.7 (*) 20.0 - 24.0 mEq/L    TCO2 31.6  0 - 100 mmol/L    Acid-Base Excess 3.9 (*) 0.0 - 2.0 mmol/L    O2 Saturation 96.0      Patient temperature 98.6      Collection site RIGHT RADIAL      Drawn by [phone]      Sample type ARTERIAL      Allens test (pass/fail) PASS  PASS   MRSA PCR SCREENING     Status: Normal   Collection Time   12/08/11 12:13 AM      Component Value Range Comment   MRSA by PCR NEGATIVE  NEGATIVE   BLOOD GAS, ARTERIAL     Status: Abnormal     Collection Time   12/08/11  1:37 AM      Component Value Range Comment   FIO2 0.40      Delivery systems BILEVEL POSITIVE AIRWAY PRESSURE      Inspiratory PAP 14      Expiratory PAP 5      pH, Arterial 7.330 (*) 7.350 - 7.450    pCO2 arterial 55.3 (*) 35.0 - 45.0 mmHg    pO2, Arterial 142.0 (*) 80.0 - 100.0 mmHg    Bicarbonate 28.4 (*) 20.0 - 24.0 mEq/L    TCO2 30.1  0 - 100 mmol/L    Acid-Base Excess 3.0 (*) 0.0 - 2.0 mmol/L    O2 Saturation 99.3      Patient temperature 98.6      Collection site RIGHT RADIAL      Drawn by [phone]      Sample type ARTERIAL DRAW      Allens test (pass/fail) PASS  PASS   PRO B NATRIURETIC PEPTIDE     Status: Abnormal   Collection Time   12/08/11  5:03 PM      Component Value Range Comment   Pro B Natriuretic peptide (BNP) 4693.0 (*) 0 - 450 pg/mL   RENAL FUNCTION PANEL     Status: Abnormal   Collection Time   12/08/11  5:03 PM      Component Value Range Comment   Sodium 132 (*) 135 - 145 mEq/L    Potassium 3.7  3.5 - 5.1 mEq/L    Chloride 93 (*) 96 - 112 mEq/L    CO2 25  19 - 32 mEq/L    Glucose, Bld 250 (*) 70 - 99 mg/dL    BUN 19  6 - 23 mg/dL DELTA CHECK NOTED   Creatinine, Ser 5.26 (*) 0.50 - 1.10 mg/dL DELTA CHECK NOTED   Calcium 9.0  8.4 - 10.5 mg/dL    Phosphorus 3.6  2.3 - 4.6 mg/dL    Albumin 3.0 (*) 3.5 - 5.2  g/dL    GFR calc non Af Amer 6 (*) >90 mL/min    GFR calc Af Amer 7 (*) >90 mL/min   CBC     Status: Abnormal   Collection Time   12/08/11  5:03 PM      Component Value Range Comment   WBC 12.7 (*) 4.0 - 10.5 K/uL    RBC 3.38 (*) 3.87 - 5.11 MIL/uL    Hemoglobin 10.7 (*) 12.0 - 15.0 g/dL    HCT 82.9 (*) 56.2 - 46.0 %    MCV 100.3 (*) 78.0 - 100.0 fL    MCH 31.7  26.0 - 34.0 pg    MCHC 31.6  30.0 - 36.0 g/dL    RDW 13.0  86.5 - 78.4 %    Platelets 461 (*) 150 - 400 K/uL   VITAMIN B12     Status: Abnormal   Collection Time   12/09/11  6:23 AM      Component Value Range Comment   Vitamin B-12 1138 (*) 211 - 911 pg/mL    FOLATE     Status: Normal   Collection Time   12/09/11  6:23 AM      Component Value Range Comment   Folate >20.0     IRON AND TIBC     Status: Abnormal   Collection Time   12/09/11  6:23 AM      Component Value Range Comment   Iron 40 (*) 42 - 135 ug/dL    TIBC 696 (*) 295 - 284 ug/dL    Saturation Ratios 23  20 - 55 %    UIBC 136  125 - 400 ug/dL   FERRITIN     Status: Abnormal   Collection Time   12/09/11  6:23 AM      Component Value Range Comment   Ferritin 1134 (*) 10 - 291 ng/mL   RETICULOCYTES     Status: Abnormal   Collection Time   12/09/11  6:23 AM      Component Value Range Comment   Retic Ct Pct 2.1  0.4 - 3.1 %    RBC. 2.94 (*) 3.87 - 5.11 MIL/uL    Retic Count, Manual 61.7  19.0 - 186.0 K/uL   TSH     Status: Normal   Collection Time   12/09/11  6:23 AM      Component Value Range Comment   TSH 1.790  0.350 - 4.500 uIU/mL    Ct Chest W Contrast  12/07/2011  *RADIOLOGY REPORT*  Clinical Data: COPD.  Dialysis patient.  Presents with pleural effusion today.  Decreased breath sounds.  CT CHEST WITH CONTRAST  Technique:  Multidetector CT imaging of the chest was performed following the standard protocol during bolus administration of intravenous contrast.  Contrast: 75mL OMNIPAQUE IOHEXOL 300 MG/ML  SOLN  Comparison: 12/07/2011 chest x-ray.  08/22/2008 chest CT.  Thoracic spine MR 11/29/2011.  Findings: Thoracic kyphosis.  Prior cement augmentation T11, T12 and L1.  Compression fractures C3, T5, T7 and T10 as noted on the recent MR.  Bibasilar atelectasis/infiltrates with pleural effusions greater on the left.  Cardiomegaly.  Coronary artery calcifications.  Marked ectasia of the thoracic aorta ascending thoracic aorta measures up to 3.3 cm and descending thoracic aorta measures up to 3.4 cm.  Bilateral renal cyst.  Small cyst within the liver.  Minimal nodularity of the thyroid gland.  IMPRESSION: Bibasilar atelectasis/infiltrates with pleural effusions greater on the  left.  Thoracic kyphosis.  Prior cement augmentation  T11, T12 and L1. Compression fractures C3, T5, T7 and T10 as noted on the recent MR.  Cardiomegaly with coronary calcifications.  Prominent ectasia thoracic aorta and great vessels.   Original Report Authenticated By: Fuller Canada, M.D.    Dg Chest Port 1 View  12/09/2011  *RADIOLOGY REPORT*  Clinical Data: Follow-up evaluation of infiltrate.  PORTABLE CHEST - 1 VIEW  Comparison: Chest x-ray 12/07/2011.  Findings: Bibasilar opacities persist, compatible with areas of subsegmental atelectasis.  Small bilateral pleural effusions (left greater than right).  No evidence of pulmonary edema.  No pneumothorax.  Heart size is within normal limits. The patient is rotated to the right on today's exam, resulting in distortion of the mediastinal contours and reduced diagnostic sensitivity and specificity for mediastinal pathology.  Atherosclerosis in the thoracic aorta.  Multiple old healed right-sided rib fractures anterolaterally are again noted.  Vascular stent in the soft tissues of the medial aspect of the left upper extremity. Post vertebroplasty changes are again noted throughout the lower thoracic and upper lumbar spine.  IMPRESSION: 1.  Persistent bibasilar subsegmental atelectasis and small bilateral pleural effusions. 2.  Atherosclerosis.   Original Report Authenticated By: Florencia Reasons, M.D.     Review of Systems  Constitutional: Positive for weight loss. Negative for fever.  Respiratory: Negative for sputum production, shortness of breath and wheezing.   Cardiovascular: Negative for chest pain.  Gastrointestinal: Negative for nausea and vomiting.  Musculoskeletal: Positive for back pain.  Neurological: Positive for weakness.    Blood pressure 85/44, pulse 99, temperature 97.4 F (36.3 C), temperature source Oral, resp. rate 32, height 5\' 3"  (1.6 m), weight 93 lb 11.1 oz (42.5 kg), SpO2 96.00%. Physical Exam  Constitutional: She is  oriented to person, place, and time. She appears well-developed and well-nourished.  Cardiovascular: Normal rate and regular rhythm.   No murmur heard. Respiratory: Effort normal and breath sounds normal. She has no wheezes.  GI: Soft. Bowel sounds are normal. There is no tenderness.  Musculoskeletal: Normal range of motion.       FROM; unable to ambulate; but transfers Pt states back pain is moderate  Neurological: She is alert and oriented to person, place, and time.  Psychiatric:       Pleasant; maybe sl confused Consent signed via phone with son in law     Assessment/Plan Thoracic 10 KP was scheduled as OP 10/8 Admitted to be evaluated for breathing issues TRH feels pt is able to undergo procedure now Scheduled now for 10/11 pts son in law has been consented via phone; in chart  Philisha Weinel A 12/09/2011, 12:25 PM

## 2011-12-10 ENCOUNTER — Inpatient Hospital Stay (HOSPITAL_COMMUNITY): Payer: Medicare Other

## 2011-12-10 LAB — CBC
Hemoglobin: 10.8 g/dL — ABNORMAL LOW (ref 12.0–15.0)
MCH: 31.6 pg (ref 26.0–34.0)
MCHC: 31.5 g/dL (ref 30.0–36.0)
MCV: 100.3 fL — ABNORMAL HIGH (ref 78.0–100.0)

## 2011-12-10 LAB — BASIC METABOLIC PANEL
BUN: 16 mg/dL (ref 6–23)
Calcium: 9 mg/dL (ref 8.4–10.5)
Creatinine, Ser: 3.75 mg/dL — ABNORMAL HIGH (ref 0.50–1.10)
GFR calc non Af Amer: 10 mL/min — ABNORMAL LOW (ref >90)
Glucose, Bld: 92 mg/dL (ref 70–99)
Sodium: 131 mEq/L — ABNORMAL LOW (ref 135–145)

## 2011-12-10 MED ORDER — SODIUM CHLORIDE 0.9 % IV SOLN: INTRAVENOUS | Status: AC

## 2011-12-10 MED ORDER — MIDODRINE HCL 5 MG PO TABS
ORAL_TABLET | ORAL | Status: AC
  Filled 2011-12-10: qty 2

## 2011-12-10 MED ORDER — TOBRAMYCIN SULFATE 1.2 G IJ SOLR
INTRAMUSCULAR | Status: AC
  Filled 2011-12-10: qty 1.2

## 2011-12-10 MED ORDER — IPRATROPIUM BROMIDE 0.02 % IN SOLN
0.5000 mg | Freq: Three times a day (TID) | RESPIRATORY_TRACT | Status: DC
  Administered 2011-12-10 – 2011-12-11 (×3): 0.5 mg via RESPIRATORY_TRACT
  Filled 2011-12-10 (×4): qty 2.5

## 2011-12-10 MED ORDER — IOHEXOL 300 MG/ML  SOLN
50.0000 mL | Freq: Once | INTRAMUSCULAR | Status: AC | PRN
  Administered 2011-12-10: 5 mL via INTRAVENOUS

## 2011-12-10 MED ORDER — LEVALBUTEROL HCL 0.63 MG/3ML IN NEBU
0.6300 mg | INHALATION_SOLUTION | Freq: Three times a day (TID) | RESPIRATORY_TRACT | Status: DC
  Administered 2011-12-10 – 2011-12-11 (×3): 0.63 mg via RESPIRATORY_TRACT
  Filled 2011-12-10 (×7): qty 3

## 2011-12-10 NOTE — Progress Notes (Signed)
Physical Therapy Note: order received, please refer to eval from 10/10 for pt function. Total assist at baseline and currently at same level. Discussed with Algis Downs and will sign off. Thanks Delaney Meigs, PT [phone]

## 2011-12-10 NOTE — Procedures (Signed)
S/P VP T 10

## 2011-12-10 NOTE — Progress Notes (Signed)
[facility] Progress Note    [name] ZOX:[id number] DOB: 26-Feb-1920 DOA: 12/07/2011 PCP: Lynnea Ferrier TOM, MD   Assessment/Plan:  Dyspnea with pleural effusion.  likely multifactorial: diastolic dysfunction; possible infiltrates on CT Chest; chronic lung disease with possible COPD; ESRD with variable volume status.   Ms.  Shugars no longer has dyspnea.  Her breathing is relaxed.    CT of chest reveals bibasilar atelectasis vs/ infiltrates with pleural effusions greater on the left -   ABG at admission suggested a mild respiratory acidosis   x-rays do suggest that she likely suffers from chronic pulmonary disease not otherwise specified   Further her 2d echo completed this hospitalization indicated an EF of 60 - 65%.  She may have a component of diastolic CHF.  T10 compression fracture Will move forward with Kyphoplasty on 12/10/2011 via Interventional Radiology. May d/c 10/12 if stable after kyphoplasty.  Will ask PT to see her early on 10/12 if possible  End-stage renal disease on Monday Wednesday Friday dialysis Nephrology Consulted.  Continue the patient's usual dialysis schedule (M/W/F)  Chronic anemia related to kidney disease Hemoglobin appears to be stable at this time  Glaucoma  Hypotension Midodrine increased to bid each day 10/10.  Blood pressure improved but still borderline today.  Leukocytosis. Normalized 12/10/2011.  No antibiotics needed.  History of multiple posterior circulation infarcts  Code Status: No CODE BLUE Disposition Plan: Will d/c 12/11/11 if medically stable.  Consultants: Nephrology  Antibiotics: None  DVT prophylaxis: Lovenox   Brief narrative: 78 year old female with Hx of multiple compression fractures s/p KP's having ongoing pain that was affecting her appetite and ability to ambulate and possible breathing per family.  She was scheduled to have kyphoplasty procedure the day of her admission - procedure was  canceled due to a small left pleural effusion.  Patient was noted to be completely nonfocal but arousable only to sternal rub with minimal eye opening and mumbling. Chest x-ray was done because of shortness of breath. It showed a small left-sided pleural effusion and the patient was sent to the ER for further evaluation, because of concern about respiratory distress with moderate sedation as well as cardiac event given the patient's age.  Family suggested the patient has been short of breath for 3 or 4 years, but had become worse over the last 3 weeks, subsequent to patient's T10 compression fracture.    HPI/Subjective: Patient is alert and interactive.   She denies any complaints.     Objective: Blood pressure 100/61, pulse 72, temperature 97.7 F (36.5 C), temperature source Oral, resp. rate 20, height 5\' 3"  (1.6 m), weight 42.5 kg (93 lb 11.1 oz), SpO2 94.00%.  Intake/Output Summary (Last 24 hours) at 12/10/11 1230 Last data filed at 12/10/11 0451  Gross per 24 hour  Intake    100 ml  Output      0 ml  Net    100 ml     Exam: General: Awakened from sleep.  Comfortable. Mildly confused. Lungs: breath sounds throughout with no active wheeze and no focal crackles - diminished breath sounds in bases bilaterally Cardiovascular: Regular rate and rhythm without murmur gallop or rub  Abdomen: thin Nontender, nondistended, soft, bowel sounds positive, no rebound, no ascites, no appreciable mass Extremities: No significant cyanosis, clubbing, or edema bilateral lower extremities Skin:  Thin, multiple bruises on her arms. Neuro:  Patient awakened from sleep but orientated only to person.  [place]  Data Reviewed:  Basic Metabolic Panel:  Lab 12/10/11 1610 12/08/11 1703 12/07/11 1215 12/07/11 0735  NA 131* 132* 137 139  K 4.1 3.7 3.7 3.3*  CL 96 93* 97 97  CO2 23 25 31  36*  GLUCOSE 92 250* 91 107*  BUN 16 19 8 7   CREATININE 3.75* 5.26* 3.48* 3.23*  CALCIUM  9.0 9.0 9.7 9.9  MG -- -- -- --  PHOS -- 3.6 -- --   Liver Function Tests:  Lab 12/08/11 1703  AST --  ALT --  ALKPHOS --  BILITOT --  PROT --  ALBUMIN 3.0*   CBC:  Lab 12/10/11 0550 12/08/11 1703 12/07/11 2033 12/07/11 1215 12/07/11 0735  WBC 9.8 12.7* 10.9* 10.4 9.3  NEUTROABS -- -- -- 7.9* 6.9  HGB 10.8* 10.7* 12.4 12.1 12.5  HCT 34.3* 33.9* 39.6 38.3 40.1  MCV 100.3* 100.3* 102.6* 102.1* 102.6*  PLT 315 461* 338 392 407*   Cardiac Enzymes:  Lab 12/07/11 1216  CKTOTAL --  CKMB --  CKMBINDEX --  TROPONINI <0.30    Recent Results (from the past 240 hour(s))  MRSA PCR SCREENING     Status: Normal   Collection Time   12/08/11 12:13 AM      Component Value Range Status Comment   MRSA by PCR NEGATIVE  NEGATIVE Final      Studies: 2D Echocardiogram 12/09/2011 Study Conclusions  - Left ventricle: The cavity size was normal. Wall thickness was normal. Systolic function was normal. The estimated ejection fraction was in the range of 60% to 65%. There was an increased relative contribution of atrial contraction to ventricular filling.  - Aortic valve: Mildly calcified annulus.   Scheduled Meds:  Reviewed in detail by the Attending Physician   Algis Downs, PA-C [facility] Pager: [phone]   LOS: 3 days

## 2011-12-10 NOTE — Consult Note (Signed)
WOC ostomy consult  Stoma type/location: ileostomy, RLQ Stomal assessment/size: 1 1/4" aprox. Visualized thru pouch, pouch just changed this am per bedside nursing staff.  Output liquid green-brown and flatus in pouch Ostomy pouching: 2pc. 2  3/4" wafer and pouch in place, will order 2 1/4" wafer and corresponding pouch for bedside so that staff have in case they need to change over the weekend, otherwise would recommend 2x wk pouch changes on T/FRI.  Education provided: pt was total care at home, will not provide further education at this time for patient for that reason.   WOC will follow along with you  Yarethzy Croak Marlena Clipper, CWOCN [phone]

## 2011-12-10 NOTE — Progress Notes (Signed)
Subjective: s/p VP to T10 today  Objective Vital signs in last 24 hours: Filed Vitals:   12/10/11 1530 12/10/11 1535 12/10/11 1540 12/10/11 1552  BP: 77/47 85/51 95/50  85/49  Pulse: 75 84    Temp:      TempSrc:      Resp:      Height:      Weight:      SpO2:       Weight change:   Intake/Output Summary (Last 24 hours) at 12/10/11 1603 Last data filed at 12/10/11 0451  Gross per 24 hour  Intake     50 ml  Output      0 ml  Net     50 ml   Labs: Basic Metabolic Panel:  Lab 12/10/11 1610 12/08/11 1703 12/07/11 1215 12/07/11 0735  NA 131* 132* 137 139  K 4.1 3.7 3.7 3.3*  CL 96 93* 97 97  CO2 23 25 31  36*  GLUCOSE 92 250* 91 107*  BUN 16 19 8 7   CREATININE 3.75* 5.26* 3.48* 3.23*  ALB -- -- -- --  CALCIUM 9.0 9.0 9.7 9.9  PHOS -- 3.6 -- --   Liver Function Tests:  Lab 12/08/11 1703  AST --  ALT --  ALKPHOS --  BILITOT --  PROT --  ALBUMIN 3.0*   No results found for this basename: LIPASE:3,AMYLASE:3 in the last 168 hours No results found for this basename: AMMONIA:3 in the last 168 hours CBC:  Lab 12/10/11 0550 12/08/11 1703 12/07/11 2033 12/07/11 1215 12/07/11 0735  WBC 9.8 12.7* 10.9* 10.4 --  NEUTROABS -- -- -- 7.9* 6.9  HGB 10.8* 10.7* 12.4 12.1 --  HCT 34.3* 33.9* 39.6 38.3 --  MCV 100.3* 100.3* 102.6* 102.1* --  PLT 315 461* 338 392 --   PT/INR: @labrcntip (inr:5) Cardiac Enzymes:  Lab 12/07/11 1216  CKTOTAL --  CKMB --  CKMBINDEX --  TROPONINI <0.30   CBG: No results found for this basename: GLUCAP:5 in the last 168 hours  Iron Studies:   Lab 12/09/11 0623  IRON 40*  TIBC 176*  TRANSFERRIN --  FERRITIN 1134*    Physical Exam:  Blood pressure 85/49, pulse 84, temperature 98.1 F (36.7 C), temperature source Oral, resp. rate 22, height 5\' 3"  (1.6 m), weight 44.3 kg (97 lb 10.6 oz), SpO2 94.00%.  RUE:AVWU frail elderly BF, co back pain NAD Neck: no jvd  Heart: RRR, no murmur or rub heard  Lungs: Decreased BS at bases, other clear  with poor air movement Abdomen: bs +=, soft, nontender  Extremities: no pedal edema  Skin:abrsiomn left lower leg, no rash or overt ulcers  Neuro:lethargic But awake/ oriented to [place], person, not date,moves all extremities to command  Dialysis Access: positive bruit left upper arm avgg, multiple ecchymoses on extremities  Dialysis Orders: Center: [facility] on mwf .  EDW 45kg HD Bath 2.ok, 2.25 ca Time 4hrs Heparin 5ml. Access LUA AVGG BFR 400 DFR 800  Zemplar 0 mcg IV/HD Epogen 0 Units IV/HD Venofer 0 Other 0   Assessment/Plan  1. Acute on chronic resp failure in setting of severe COPD/chronic CO2 retention;  small effusions by chest CT, no vol overload or pulm edema. Prob due to underlying pulm disease. 2. Acute on chronic back pain- s/p VP to T10 today 3. CKD, cont hd mwf 4. Hypertension/volume - was on Midodrine 15mg  three times per week, changing to 10 mg bid today. Discussed with [facility].  2.5kg below dry weight and  BP very low; give NS bolus, no UF with HD today.  5. Anemia - hgb= 12.4/ [facility] ep[facility] or [facility] 6. Metabolic bone disease - 9.7 CA/ no zemplar/ no out pt. binders 7. Nutrition - alb 3.5 last at outpt Hd On 11/18/11= use Breeze supplement/ on Megace       8.   DNR  Vinson Moselle  MD [facility] [phone] pgr    [phone] cell 12/10/2011, 4:03 PM

## 2011-12-10 NOTE — Progress Notes (Signed)
Palliative Medicine Team consult for goals of care requested by Dr Robb Matar; patient seen at bedside minimally responsive to have Kyphoplasty today and then plan for HD - per son-in-law Randa Spike he and his wife/patient's daughter are not available until after 6 pm this evening -offered multiple meeting times for Saturday and Sunday; however due to work/family constraints Mr. Alison Stalling indicates the best time for family to meet is Monday 12/13/11 @ 1:00 pm - he is aware if patient is stable she may be discharged over the week-end - he has PMT phone to contact to inform if the family has available time before Monday to meet. Dr Robb Matar aware of above - PMT will plan to meet with family Monday as noted above if patient remains in hospital  Valente David, RN 12/10/2011, 11:31 AM Palliative Medicine Team RN Liaison [phone]

## 2011-12-10 NOTE — Progress Notes (Signed)
-  Agree with above. -Kyphoplasty 12/10/2011. - 12/11/2011.

## 2011-12-11 DIAGNOSIS — I959 Hypotension, unspecified: Secondary | ICD-10-CM

## 2011-12-11 DIAGNOSIS — S22009A Unspecified fracture of unspecified thoracic vertebra, initial encounter for closed fracture: Secondary | ICD-10-CM

## 2011-12-11 DIAGNOSIS — S22070A Wedge compression fracture of T9-T10 vertebra, initial encounter for closed fracture: Secondary | ICD-10-CM | POA: Diagnosis present

## 2011-12-11 NOTE — Progress Notes (Signed)
Subjective: Pt ok this am. States she has much less back pain. A little sore but much better  Objective: Physical Exam: BP 95/47  Pulse 102  Temp 98.4 F (36.9 C) (Oral)  Resp 20  Ht 5\' 3"  (1.6 m)  Wt 100 lb 5 oz (45.5 kg)  BMI 17.77 kg/m2  SpO2 98% T10 access site dressing clean, dry No hematoma, minimally tender   Labs: CBC  Basename 12/10/11 0550 12/08/11 1703  WBC 9.8 12.7*  HGB 10.8* 10.7*  HCT 34.3* 33.9*  PLT 315 461*   BMET  Basename 12/10/11 0550 12/08/11 1703  NA 131* 132*  K 4.1 3.7  CL 96 93*  CO2 23 25  GLUCOSE 92 250*  BUN 16 19  CREATININE 3.75* 5.26*  CALCIUM 9.0 9.0   LFT  Basename 12/08/11 1703  PROT --  ALBUMIN 3.0*  AST --  ALT --  ALKPHOS --  BILITOT --  BILIDIR --  IBILI --  LIPASE --   PT/INR No results found for this basename: LABPROT:2,INR:2 in the last 72 hours   Studies/Results: No results found.  Assessment/Plan: S/p T10 VP Clinically improved, less pain Will need follow up in 2 weeks.    LOS: 4 days    Brayton El PA-C 12/11/2011 10:20 AM

## 2011-12-11 NOTE — Progress Notes (Signed)
Subjective:   No complaints, comfortable.  Objective: Vital signs in last 24 hours: Temp:  [97.7 F (36.5 C)-98.4 F (36.9 C)] 98.4 F (36.9 C) (10/12 0530) Pulse Rate:  [75-102] 102  (10/12 0530) Resp:  [20-32] 20  (10/12 0530) BP: (72-140)/(36-76) 95/47 mmHg (10/12 0530) SpO2:  [98 %-100 %] 98 % (10/12 0920) Weight:  [44.3 kg (97 lb 10.6 oz)-45.5 kg (100 lb 5 oz)] 45.5 kg (100 lb 5 oz) (10/11 1920) Weight change:   Intake/Output from previous day: 10/11 0701 - 10/12 0700 In: -  Out: -548 [Stool:200]   EXAM: General appearance:  Frail, lethargic, but alert, in no apparent distress Resp:  Decreased breath sounds, but clear Cardio:  RRR without murmur or rub GI:  + BS, soft and nontender Extremities: No edema  Access:  AVG @ LUA with + bruit  Lab Results:  Basename 12/10/11 0550 12/08/11 1703  WBC 9.8 12.7*  HGB 10.8* 10.7*  HCT 34.3* 33.9*  PLT 315 461*   BMET:  Basename 12/10/11 0550 12/08/11 1703  NA 131* 132*  K 4.1 3.7  CL 96 93*  CO2 23 25  GLUCOSE 92 250*  BUN 16 19  CREATININE 3.75* 5.26*  CALCIUM 9.0 9.0  ALBUMIN -- 3.0*   No results found for this basename: PTH:2 in the last 72 hours Iron Studies:  Basename 12/09/11 0623  IRON 40*  TIBC 176*  TRANSFERRIN --  FERRITIN 1134*   Dialysis Orders: Center: [facility] on mwf .  EDW 45kg HD Bath 2.ok, 2.25 ca Time 4hrs Heparin 5ml. Access LUA AVGG BFR 400 DFR 800  Zemplar 0 mcg IV/HD Epogen 0 Units IV/HD Venofer 0 Other 0   Assessment/Plan: 1. Acute on chronic resp failure - in setting of severe COPD/chronic CO2 retention; small effusions by chest CT, no vol overload or pulm edema; likely due to underlying pulm disease. 2. Acute on chronic back pain - s/p VP to T10 yesterday, needs follow-up in 2 weeks. 3. CKD - HD on MWF @ [facility]; K 3.7.  Next HD 10/14. 4. Hypertension/volume - BP 95/47 most recently, was on Midodrine 15 mg three times per week, changed to 10 mg bid, discussed with [facility]; wt 45.5 kg s/p net  UF -748 ml yesterday, EDW 45 kg. 5. Anemia - Hgb 10.8, no outpatient epo or iron. 6. Metabolic bone disease - Ca 9 (10 corrected), P 3.6, no zemplar/ no out pt. binders 7. Nutrition - alb 3, use Breeze supplement/ on Megace. 8. DNR     LOS: 4 days   LYLES,CHARLES 12/11/2011,11:39 AM   Patient seen and examined and agree with assessment and plan as above.  Vinson Moselle  MD [facility] [phone] pgr    [phone] cell 12/11/2011, 12:44 PM

## 2011-12-11 NOTE — Progress Notes (Signed)
Nsg Discharge Note  Admit Date:  12/07/2011 Discharge date: 12/11/2011   [name] to be D/C'd Home per MD order.  AVS completed.  Copy for chart, and copy for patient signed, and dated. Patient/caregiver able to verbalize understanding.  Discharge Medication:  [name], [name]  Home Medication Instructions [id number]   Printed on:12/11/11 1336  Medication Information                    latanoprost (XALATAN) 0.005 % ophthalmic solution Place 1 drop into both eyes at bedtime.            dorzolamide (TRUSOPT) 2 % ophthalmic solution Place 1 drop into both eyes 2 (two) times daily.            megestrol (MEGACE) 40 MG/ML suspension Take 400 mg by mouth 2 (two) times daily.            timolol (BETIMOL) 0.5 % ophthalmic solution Place 1 drop into both eyes 2 (two) times daily.            protein supplement (RESOURCE BENEPROTEIN) 6 g POWD Take 1 scoop by mouth 2 (two) times daily with a meal.            dipyridamole-aspirin (AGGRENOX) 25-200 MG per 12 hr capsule Take 1 capsule by mouth daily.            midodrine (PROAMATINE) 10 MG tablet Take 15 mg by mouth 3 (three) times a week. Takes 1 tablet prior to each HD treatments on Monday Wednesday and friday           famotidine (PEPCID) 20 MG tablet Take 20 mg by mouth at bedtime.            albuterol (PROVENTIL HFA;VENTOLIN HFA) 108 (90 BASE) MCG/ACT inhaler Inhale 2 puffs into the lungs 4 (four) times daily as needed. For shortness of breath.           multivitamin (RENA-VIT) TABS tablet Take 1 tablet by mouth daily.             Discharge Assessment: Filed Vitals:   12/11/11 0530  BP: 95/47  Pulse: 102  Temp: 98.4 F (36.9 C)  Resp: 20   Skin clean, dry and intact without evidence of skin break down, no evidence of skin tears noted. IV catheter discontinued intact. Site without signs and symptoms of complications - no redness or edema noted at insertion site, patient denies c/o pain - only slight  tenderness at site.  Dressing with slight pressure applied.  D/c Instructions-Education: Discharge instructions given to patient/family with verbalized understanding. D/c education completed with patient/family including follow up instructions, medication list, d/c activities limitations if indicated, with other d/c instructions as indicated by MD - patient able to verbalize understanding, all questions fully answered. Patient instructed to return to ED, call 911, or call MD for any changes in condition.  Patient escorted via WC, and D/C home via private auto.  [name] [name], RN 12/11/2011 1:36 PM

## 2011-12-11 NOTE — Discharge Summary (Signed)
Physician Discharge Summary  [name] ZOX:[id number] DOB: 12-05-1919 DOA: 12/07/2011  PCP: Shelley Grosser, MD  Admit date: 12/07/2011 Discharge date: 12/11/2011  Recommendations for Outpatient Follow-up:  1. Point followup with her primary care Dr. in 3 weeks CL she started from a pulmonary standpoint.  Discharge Diagnoses:  Principal Problem:  *SOB (shortness of breath) Active Problems:  Dialysis patient  Hypertension  Renal insufficiency  End stage renal disease   Discharge Condition: Stable  Diet recommendation: Denies renal diet  Filed Weights   12/08/11 2115 12/10/11 1458 12/10/11 1920  Weight: 42.5 kg (93 lb 11.1 oz) 44.3 kg (97 lb 10.6 oz) 45.5 kg (100 lb 5 oz)    History of present illness:  78 year old female with Hx of multiple compression fractures in past s/p KP's. Pain is affecting her appetite, ability to ambulate and possible breathing per family.Marland Kitchen She was scheduled to have kyphoplasty procedure today, procedure was canceled small left pleural effusion.  Patient's son-in-law is the power of attorney who provides most of the history patient is completely nonfocal but arousable only to sternal rub with minimal eye opening and mumbling. Chest x-ray was done because of shortness of breath. It showed a small left-sided pleural effusion, the patient was sent to the ER for further evaluation, because of concern about respiratory distress with moderate sedation as well as cardiac event given the patient's age.  Son-in-law suggested that the patient has been short of breath for 3 or 4 years. But worse over the last 3 weeks, subsequent to patient's T10 fracture, compression fracture. She is currently not on any pain medications except occasional Tylenol,  She has had no fever chills or rigors, no hemoptysis, chest pain   Hospital Course:  Principal Problem: SOB with pleural effusion. likely multifactorial: diastolic dysfunction; chronic lung disease with possible  COPD; ESRD with variable volume status. -[name] no longer has dyspnea. Her breathing is relaxed.  -CT of chest reveals bibasilar atelectasis vs/ infiltrates with pleural effusions greater on the left -  -ABG at admission suggested a mild respiratory acidosis  -x-rays do suggest that she likely suffers from chronic pulmonary disease not otherwise specified  -Further her 2d echo completed this hospitalization indicated an EF of 60 - 65%. She may have a component of diastolic CHF. -With dialysis admitted from her shortness of breath started to improve slowly. -I have asked an outpatient hospice consult to discuss with family end of life and future goals of care.  T10 compression fracture  -Patient complaining of back pain, Kyphoplasty on 12/10/2011 via Interventional Radiology.  End-stage renal disease on Monday Wednesday Friday dialysis  -Nephrology Consulted. Continue the patient's usual dialysis schedule (M/W/F) -Nephrology decided to admit her zone 3 times a day with dialysis.  Chronic anemia related to kidney disease  Hemoglobin appears to be stable at this time  Hypotension  Midodrine increased to bid each day 10/10.   Procedures:  Kyphoplasty performed on 12/10/2011 (i.e. Studies not automatically included, echos, thoracentesis, etc; not x-rays)  Consultations:  Provisional radiology  Discharge Exam: Filed Vitals:   12/10/11 2027 12/10/11 2038 12/11/11 0530 12/11/11 0920  BP:  96/68 95/47   Pulse:  78 102   Temp:  98 F (36.7 C) 98.4 F (36.9 C)   TempSrc:  Oral Oral   Resp:  20 20   Height:      Weight:      SpO2: 98% 100% 100% 98%    General: Wake alert and oriented x2 Cardiovascular: Irregular  rate and rhythm Respiratory: Good air movement clear to auscultation  Discharge Instructions  Discharge Orders    Future Orders Please Complete By Expires   Ambulatory referral to Hospice      Diet - low sodium heart healthy      Increase activity slowly            Medication List     As of 12/11/2011  9:57 AM    TAKE these medications         albuterol 108 (90 BASE) MCG/ACT inhaler   Commonly known as: PROVENTIL HFA;VENTOLIN HFA   Inhale 2 puffs into the lungs 4 (four) times daily as needed. For shortness of breath.      dipyridamole-aspirin 200-25 MG per 12 hr capsule   Commonly known as: AGGRENOX   Take 1 capsule by mouth daily.      dorzolamide 2 % ophthalmic solution   Commonly known as: TRUSOPT   Place 1 drop into both eyes 2 (two) times daily.      famotidine 20 MG tablet   Commonly known as: PEPCID   Take 20 mg by mouth at bedtime.      latanoprost 0.005 % ophthalmic solution   Commonly known as: XALATAN   Place 1 drop into both eyes at bedtime.      megestrol 40 MG/ML suspension   Commonly known as: MEGACE   Take 400 mg by mouth 2 (two) times daily.      midodrine 10 MG tablet   Commonly known as: PROAMATINE   Take 15 mg by mouth 3 (three) times a week. Takes 1 tablet prior to each HD treatments on Monday Wednesday and friday      multivitamin Tabs tablet   Take 1 tablet by mouth daily.      protein supplement 6 g Powd   Commonly known as: RESOURCE BENEPROTEIN   Take 1 scoop by mouth 2 (two) times daily with a meal.      timolol 0.5 % ophthalmic solution   Commonly known as: BETIMOL   Place 1 drop into both eyes 2 (two) times daily.           Follow-up Information    Follow up with [facility], MD. In 3 weeks. (hospital follow up)    Contact information:   [address] [place] [address] [phone]           The results of significant diagnostics from this hospitalization (including imaging, microbiology, ancillary and laboratory) are listed below for reference.    Significant Diagnostic Studies: Dg Chest 2 View  12/07/2011  *RADIOLOGY REPORT*  Clinical Data: Pre kyphoplasty evaluation.  Decreased breath sounds.  Evaluate for pleural effusion.  CHEST - 2 VIEW  Comparison:  Chest x-ray 11/04/2011.  Findings: Small left pleural effusion.  Bibasilar (left greater than right) linear opacities, favored to represent subsegmental atelectasis.  No evidence of pulmonary edema.  Prominence of interstitial markings in the medial aspect of the right upper lobe, similar to prior examinations, likely represents scarring.  Heart size is normal. The patient is rotated to the right on today's exam, resulting in distortion of the mediastinal contours and reduced diagnostic sensitivity and specificity for mediastinal pathology.  Atherosclerosis in the thoracic aorta.  Vascular stent in the soft tissues of the visualized left upper extremity. Osteopenia.  Lateral projection and demonstrates multiple thoracic spine compression fractures, similar to prior examination, with the post vertebroplasty/kyphoplasty changes in the lower thoracic and upper  lumbar spine.  IMPRESSION: 1.  Small left pleural effusion with bibasilar subsegmental atelectasis (left greater than right). 2.  Osteopenia with multiple vertebral body compression fractures and post procedural changes, similar to recent prior examination, as above. 3.  Atherosclerosis.   Original Report Authenticated By: Florencia Reasons, M.D.    Ct Chest W Contrast  12/07/2011  *RADIOLOGY REPORT*  Clinical Data: COPD.  Dialysis patient.  Presents with pleural effusion today.  Decreased breath sounds.  CT CHEST WITH CONTRAST  Technique:  Multidetector CT imaging of the chest was performed following the standard protocol during bolus administration of intravenous contrast.  Contrast: 75mL OMNIPAQUE IOHEXOL 300 MG/ML  SOLN  Comparison: 12/07/2011 chest x-ray.  08/22/2008 chest CT.  Thoracic spine MR 11/29/2011.  Findings: Thoracic kyphosis.  Prior cement augmentation T11, T12 and L1.  Compression fractures C3, T5, T7 and T10 as noted on the recent MR.  Bibasilar atelectasis/infiltrates with pleural effusions greater on the left.  Cardiomegaly.  Coronary  artery calcifications.  Marked ectasia of the thoracic aorta ascending thoracic aorta measures up to 3.3 cm and descending thoracic aorta measures up to 3.4 cm.  Bilateral renal cyst.  Small cyst within the liver.  Minimal nodularity of the thyroid gland.  IMPRESSION: Bibasilar atelectasis/infiltrates with pleural effusions greater on the left.  Thoracic kyphosis.  Prior cement augmentation T11, T12 and L1. Compression fractures C3, T5, T7 and T10 as noted on the recent MR.  Cardiomegaly with coronary calcifications.  Prominent ectasia thoracic aorta and great vessels.   Original Report Authenticated By: Fuller Canada, M.D.    Mr Thoracic Spine Wo Contrast  11/29/2011  *RADIOLOGY REPORT*  Clinical Data: 78 year old female with severe back pain.  Possible compression fracture.  Unable to ambulate.  Multiple prior vertebroplasties.  Dialysis.  Kyphosis.  MRI THORACIC SPINE WITHOUT CONTRAST  Technique:  Multiplanar and multiecho pulse sequences of the thoracic spine were obtained without intravenous contrast.  Comparison: Lumbar MRI 09/28/2011.  Chest CTA 10/22/2008.  Findings: There is marrow edema throughout the T10 vertebral body, which appeared normal in July.   There is now a patchy decreased T2 signal, but on sagittal T1 no evidence of augmentation with bone cement at this level. T10 loss of height is less than 25% compared to July.  Trace marrow edema in the right pedicle, no definite marrow edema in the left pedicle.  Minimal retropulsion of the posterior-superior endplate without significant spinal stenosis.  Chronic severe T5 and T7 compression fractures.  Chronic T12 compression fracture status post augmentation.  Multiple chronic lumbar level compression fractures also status post augmentation. No other acute osseous abnormality in the thoracic spine.  Left greater than right layering pleural effusions.  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  Thoracic spinal canal and thecal sac are patent without significant spinal stenosis. Spinal cord signal is within normal limits at all visualized levels.  Conus medullaris at L1-L2. Negative limited sagittal imaging of the cervical spine.  IMPRESSION: 1.  Acute or subacute T10 compression fracture.  Minimal loss of height.  Minimal retropulsion of bone without significant spinal stenosis.  Mild edema in the right pedicle. 2.  Chronic T5, T7, T12, and multilevel lumbar compression fractures. 3.  Layering left greater than right pleural effusions. Hemosiderosis.   Original Report Authenticated By: Harley Hallmark, M.D.    Dg Chest Port 1 View  12/09/2011  *RADIOLOGY REPORT*  Clinical Data: Follow-up  evaluation of infiltrate.  PORTABLE CHEST - 1 VIEW  Comparison: Chest x-ray 12/07/2011.  Findings: Bibasilar opacities persist, compatible with areas of subsegmental atelectasis.  Small bilateral pleural effusions (left greater than right).  No evidence of pulmonary edema.  No pneumothorax.  Heart size is within normal limits. The patient is rotated to the right on today's exam, resulting in distortion of the mediastinal contours and reduced diagnostic sensitivity and specificity for mediastinal pathology.  Atherosclerosis in the thoracic aorta.  Multiple old healed right-sided rib fractures anterolaterally are again noted.  Vascular stent in the soft tissues of the medial aspect of the left upper extremity. Post vertebroplasty changes are again noted throughout the lower thoracic and upper lumbar spine.  IMPRESSION: 1.  Persistent bibasilar subsegmental atelectasis and small bilateral pleural effusions. 2.  Atherosclerosis.   Original Report Authenticated By: Florencia Reasons, M.D.     Microbiology: Recent Results (from the past 240 hour(s))  MRSA PCR SCREENING     Status: Normal   Collection Time   12/08/11 12:13 AM      Component Value Range Status Comment   MRSA by  PCR NEGATIVE  NEGATIVE Final      Labs: Basic Metabolic Panel:  Lab 12/10/11 1610 12/08/11 1703 12/07/11 1215 12/07/11 0735  NA 131* 132* 137 139  K 4.1 3.7 3.7 3.3*  CL 96 93* 97 97  CO2 23 25 31  36*  GLUCOSE 92 250* 91 107*  BUN 16 19 8 7   CREATININE 3.75* 5.26* 3.48* 3.23*  CALCIUM 9.0 9.0 9.7 9.9  MG -- -- -- --  PHOS -- 3.6 -- --   Liver Function Tests:  Lab 12/08/11 1703  AST --  ALT --  ALKPHOS --  BILITOT --  PROT --  ALBUMIN 3.0*   No results found for this basename: LIPASE:5,AMYLASE:5 in the last 168 hours No results found for this basename: AMMONIA:5 in the last 168 hours CBC:  Lab 12/10/11 0550 12/08/11 1703 12/07/11 2033 12/07/11 1215 12/07/11 0735  WBC 9.8 12.7* 10.9* 10.4 9.3  NEUTROABS -- -- -- 7.9* 6.9  HGB 10.8* 10.7* 12.4 12.1 12.5  HCT 34.3* 33.9* 39.6 38.3 40.1  MCV 100.3* 100.3* 102.6* 102.1* 102.6*  PLT 315 461* 338 392 407*   Cardiac Enzymes:  Lab 12/07/11 1216  CKTOTAL --  CKMB --  CKMBINDEX --  TROPONINI <0.30   BNP: BNP (last 3 results)  Basename 12/08/11 1703  PROBNP 4693.0*   CBG: No results found for this basename: GLUCAP:5 in the last 168 hours  Time coordinating discharge: *40 minutes  Signed:  Marinda Elk  [facility] 12/11/2011, 9:57 AM

## 2011-12-13 ENCOUNTER — Telehealth (HOSPITAL_COMMUNITY): Payer: Self-pay | Admitting: *Deleted

## 2011-12-13 ENCOUNTER — Telehealth (HOSPITAL_COMMUNITY): Payer: Self-pay

## 2011-12-13 NOTE — Telephone Encounter (Signed)
Per Dr. Corliss Skains have the family let us know how [name] is doing in 2 weeks.  If she is better she doesn't have to come back to see him.

## 2011-12-19 ENCOUNTER — Emergency Department (HOSPITAL_COMMUNITY): Payer: Medicare Other

## 2011-12-19 ENCOUNTER — Observation Stay (HOSPITAL_COMMUNITY)
Admission: EM | Admit: 2011-12-19 | Discharge: 2011-12-31 | Disposition: E | Payer: Medicare Other | Attending: Internal Medicine | Admitting: Internal Medicine

## 2011-12-19 ENCOUNTER — Encounter (HOSPITAL_COMMUNITY): Payer: Self-pay | Admitting: Emergency Medicine

## 2011-12-19 DIAGNOSIS — J9600 Acute respiratory failure, unspecified whether with hypoxia or hypercapnia: Secondary | ICD-10-CM | POA: Diagnosis present

## 2011-12-19 DIAGNOSIS — A419 Sepsis, unspecified organism: Secondary | ICD-10-CM

## 2011-12-19 DIAGNOSIS — J449 Chronic obstructive pulmonary disease, unspecified: Secondary | ICD-10-CM | POA: Insufficient documentation

## 2011-12-19 DIAGNOSIS — I959 Hypotension, unspecified: Secondary | ICD-10-CM

## 2011-12-19 DIAGNOSIS — N039 Chronic nephritic syndrome with unspecified morphologic changes: Secondary | ICD-10-CM | POA: Insufficient documentation

## 2011-12-19 DIAGNOSIS — H35359 Cystoid macular degeneration, unspecified eye: Secondary | ICD-10-CM

## 2011-12-19 DIAGNOSIS — N186 End stage renal disease: Secondary | ICD-10-CM | POA: Insufficient documentation

## 2011-12-19 DIAGNOSIS — D72829 Elevated white blood cell count, unspecified: Secondary | ICD-10-CM

## 2011-12-19 DIAGNOSIS — D631 Anemia in chronic kidney disease: Secondary | ICD-10-CM | POA: Insufficient documentation

## 2011-12-19 DIAGNOSIS — J811 Chronic pulmonary edema: Secondary | ICD-10-CM

## 2011-12-19 DIAGNOSIS — J4489 Other specified chronic obstructive pulmonary disease: Secondary | ICD-10-CM | POA: Insufficient documentation

## 2011-12-19 DIAGNOSIS — I120 Hypertensive chronic kidney disease with stage 5 chronic kidney disease or end stage renal disease: Secondary | ICD-10-CM | POA: Insufficient documentation

## 2011-12-19 DIAGNOSIS — R0602 Shortness of breath: Principal | ICD-10-CM | POA: Insufficient documentation

## 2011-12-19 DIAGNOSIS — Z992 Dependence on renal dialysis: Secondary | ICD-10-CM | POA: Insufficient documentation

## 2011-12-19 LAB — CBC WITH DIFFERENTIAL/PLATELET
Basophils Absolute: 0 10*3/uL (ref 0.0–0.1)
Lymphocytes Relative: 10 % — ABNORMAL LOW (ref 12–46)
Lymphs Abs: 2 10*3/uL (ref 0.7–4.0)
MCV: 102.1 fL — ABNORMAL HIGH (ref 78.0–100.0)
Neutro Abs: 16.7 10*3/uL — ABNORMAL HIGH (ref 1.7–7.7)
Neutrophils Relative %: 82 % — ABNORMAL HIGH (ref 43–77)
Platelets: 376 10*3/uL (ref 150–400)
RBC: 3.34 MIL/uL — ABNORMAL LOW (ref 3.87–5.11)
WBC: 20.4 10*3/uL — ABNORMAL HIGH (ref 4.0–10.5)

## 2011-12-19 LAB — BASIC METABOLIC PANEL
CO2: 33 mEq/L — ABNORMAL HIGH (ref 19–32)
Chloride: 98 mEq/L (ref 96–112)
Glucose, Bld: 167 mg/dL — ABNORMAL HIGH (ref 70–99)
Potassium: 3.4 mEq/L — ABNORMAL LOW (ref 3.5–5.1)
Sodium: 138 mEq/L (ref 135–145)

## 2011-12-19 LAB — LACTIC ACID, PLASMA: Lactic Acid, Venous: 1.2 mmol/L (ref 0.5–2.2)

## 2011-12-19 MED ORDER — SODIUM CHLORIDE 0.9 % IV SOLN
Freq: Once | INTRAVENOUS | Status: AC
  Administered 2011-12-19: 16:00:00 via INTRAVENOUS

## 2011-12-25 LAB — CULTURE, BLOOD (ROUTINE X 2)
Culture: NO GROWTH
Culture: NO GROWTH

## 2011-12-31 NOTE — ED Notes (Signed)
Family reports pt has been unable to ambulate due to back pain since last admitted to hospital.  States that they normally get pt up and have her sit in recliner during the day.  States pt did get up yesterday.  Reports decreased LOC since this morning with sob.

## 2011-12-31 NOTE — ED Provider Notes (Signed)
History     CSN: [id number]  Arrival date & time 23-Dec-2011  1458   First MD Initiated Contact with Patient 12/23/2011 1505      Chief Complaint  Patient presents with  . Shortness of Breath     HPI Per EMS pt family sob starting sometime today with decreased LOC over 24hrs. Pt was able to sit and talk yesterday and is not speaking today. Pt was sat at 60% RA on EMS arrival, now on nonrebreather 15 sats 92%. Pt given 5 albuterol. 22g rt forearm. Dialysis shunt in left upper arm. Due for dialysis tomorrow.  DNR/DNI per family.  Past Medical History  Diagnosis Date  . Anemia   . Cystoid macular edema   . Dialysis patient   . Cataract     h/o  . Glaucoma   . Hypertension   . Renal insufficiency   . COPD (chronic obstructive pulmonary disease)     Past Surgical History  Procedure Date  . Abdominal hysterectomy   . Appendectomy   . Cataract extraction   . Arteriovenous graft placement   . Thrombectomy w/ embolectomy 01/16/2011    Procedure: THROMBECTOMY ARTERIOVENOUS GORE-TEX GRAFT;  Surgeon: Juleen China, MD;  Location: [facility] OR;  Service: Vascular;  Laterality: Left;  repair of left arm graft bleed and revision of left arm graft.  . Kyphosis surgery     multiple levels lower thoracic and lumbar spine    No family history on file.  History  Substance Use Topics  . Smoking status: Never Smoker   . Smokeless tobacco: Not on file  . Alcohol Use: No    OB History    Grav Para Term Preterm Abortions TAB SAB Ect Mult Living                  Review of Systems  Unable to perform ROS   Allergies  Review of patient's allergies indicates no known allergies.  Home Medications   Current Outpatient Rx  Name Route Sig Dispense Refill  . ALBUTEROL SULFATE HFA 108 (90 BASE) MCG/ACT IN AERS Inhalation Inhale 2 puffs into the lungs 4 (four) times daily as needed. For shortness of breath.    . ASPIRIN-DIPYRIDAMOLE ER 25-200 MG PO CP12 Oral Take 1 capsule by mouth daily.       . DORZOLAMIDE HCL 2 % OP SOLN Both Eyes Place 1 drop into both eyes 2 (two) times daily.     Marland Kitchen FAMOTIDINE 20 MG PO TABS Oral Take 20 mg by mouth at bedtime.     Marland Kitchen LATANOPROST 0.005 % OP SOLN Both Eyes Place 1 drop into both eyes at bedtime.     . MEGESTROL ACETATE 40 MG/ML PO SUSP Oral Take 400 mg by mouth 2 (two) times daily.     Marland Kitchen MIDODRINE HCL 10 MG PO TABS Oral Take 15 mg by mouth 3 (three) times a week. Takes 1 tablet prior to each HD treatments on Monday Wednesday and friday    . RENA-VITE PO TABS Oral Take 1 tablet by mouth daily.    . RESOURCE INSTANT PROTEIN PO PWD PACKET Oral Take 1 scoop by mouth 2 (two) times daily with a meal.     . TIMOLOL HEMIHYDRATE 0.5 % OP SOLN Both Eyes Place 1 drop into both eyes 2 (two) times daily.       BP 52/41  Pulse 36  Temp 97.1 F (36.2 C) (Rectal)  Resp 6  SpO2 36%  Physical Exam  Nursing note and vitals reviewed. Constitutional: She appears well-developed. She appears lethargic. She appears ill. She appears distressed.  HENT:  Head: Normocephalic and atraumatic.  Eyes: Pupils are equal, round, and reactive to light.  Neck: Normal range of motion.  Cardiovascular: Normal rate and intact distal pulses.   Pulmonary/Chest: Tachypnea noted. She is in respiratory distress. She has rales.  Abdominal: Normal appearance. She exhibits no distension.  Musculoskeletal: Normal range of motion.  Neurological: She appears lethargic. No cranial nerve deficit. She exhibits abnormal muscle tone. GCS eye subscore is 4. GCS verbal subscore is 2. GCS motor subscore is 5.  Skin: Skin is warm and dry. No rash noted.  Psychiatric: She has a normal mood and affect. Her behavior is normal.    ED Course  Procedures (including critical care time) While waiting to be admitted the patient suddenly suffered respiratory arrest follwed by cardiac arrest.  No CPR done.  Family at bedside during event. Labs Reviewed  CBC WITH DIFFERENTIAL - Abnormal; Notable for the  following:    WBC 20.4 (*)     RBC 3.34 (*)     Hemoglobin 10.6 (*)     HCT 34.1 (*)     MCV 102.1 (*)     Neutrophils Relative 82 (*)     Neutro Abs 16.7 (*)     Lymphocytes Relative 10 (*)     Monocytes Absolute 1.4 (*)     All other components within normal limits  BASIC METABOLIC PANEL - Abnormal; Notable for the following:    Potassium 3.4 (*)     CO2 33 (*)     Glucose, Bld 167 (*)     Creatinine, Ser 4.33 (*)     GFR calc non Af Amer 8 (*)     GFR calc Af Amer 9 (*)     All other components within normal limits  LACTIC ACID, PLASMA  CULTURE, BLOOD (ROUTINE X 2)  CULTURE, BLOOD (ROUTINE X 2)   Dg Chest Portable 1 View  01/13/12  *RADIOLOGY REPORT*  Clinical Data: Shortness of breath  PORTABLE CHEST - 1 VIEW  Comparison: 12/09/2011  Findings: There is central mild vascular congestion and mild perihilar interstitial prominence suspicious for mild interstitial edema.  Bilateral small pleural effusions right greater than left with bilateral basilar atelectasis or infiltrate. Multilevel prior vertebral plasty again noted lower thoracic spine.  IMPRESSION: Central vascular congestion and mild perihilar interstitial prominence suspicious for early edema.  Bilateral small pleural effusion with bilateral basilar atelectasis or infiltrate.   Original Report Authenticated By: Natasha Mead, M.D.      1. Sepsis   2. Leukocytosis   3. Cystoid macular edema   4. SOB (shortness of breath)   5. Acute respiratory failure   6. End stage renal disease   7. Hypotension   8. Pulmonary edema       MDM  PMD notified and will sign Death Certificate       Nelia Shi, MD 12/20/11 2300

## 2011-12-31 NOTE — ED Notes (Signed)
Support given to family.  Patient Advocate and Chaplain with family at this time.

## 2011-12-31 NOTE — ED Notes (Addendum)
Pulse ox 80-86% on NRB at 15L.  Admitting MD at bedside and is aware of sats. Family has verbalized to RT, RN, and EDP that they do not wish for pt to have CPR or to be intubated. Dr. Clydia Llano at bedside and is aware that pt needs DNR placed on chart and is aware of family wishes.

## 2011-12-31 NOTE — ED Notes (Signed)
RT unable to obtain ABG. 

## 2011-12-31 NOTE — ED Notes (Signed)
RT at bedside for ABG

## 2011-12-31 NOTE — ED Notes (Signed)
Dr. Radford Pax notified that RT unable to obtain ABG and notified that pt does not make urine (no catheter).

## 2011-12-31 NOTE — Progress Notes (Signed)
Chaplain reported to ED Room D36 after receiving a pager request from the nurse. Patient has expired and family members were gathered around her bed. Chaplain shared words of comfort and prayed with family members. Chaplain with the aid of the Social worker situated family members in consultation room B for quiet and private time. Family members thanked Orthoptist for the spiritual support and ministry of presence. Chaplain will continue to provide spiritual care to family members as needed.

## 2011-12-31 NOTE — ED Notes (Signed)
Per EMS pt family sob starting sometime today with decreased LOC over 24hrs.  Pt was able to sit and talk yesterday and is not speaking today.  Pt was sat at 60% RA on EMS arrival, now on nonrebreather 15 sats 92%.  Pt given 5 albuterol.  22g rt forearm.  Dialysis shunt in left upper arm.  Due for dialysis tomorrow.  113/62, resp 44,HR 116 no lung sound on right.

## 2011-12-31 NOTE — Consult Note (Signed)
[facility] Medical Consultation  [name] ZOX:[id number] DOB: [birth year] DOA: 2012/01/03 PCP: Leo Grosser, MD   Requesting physician: Nelia Shi, MD  Date of consultation: 03-Jan-2012 Reason for consultation: Possible for admission  Impression/Recommendations Principal Problem:  *Acute respiratory failure Active Problems:  End stage renal disease  Hypotension  Pulmonary edema  Assessment and plan  I saw the patient in consultation for Dr. Nelva Nay from the emergency department, when I went to see the patient she is unresponsive even to painful stimuli, her blood pressure is 80 some of 40, and her saturation in the 70s and 100% oxygen through Ventimask. At bedside with her daughter Suzette Battiest, her son Reita Cliche and her niece Isabelle Course. I spoke to them about patient prognosis secondary to comorbid conditions and the current situation of shock. I explained to them the 2 routes of management, aggressive route to admit her to step down, give her IV fluids, probably Place central line, give her IV fluids for her low blood pressure and resume dialysis. Even with these aggressive measures I explained to them she has very poor prognosis to survive this hospital stay.  Then I explained the other route which is for total comfort measures, to keep her comfortable to provide oxygen, and morphine as needed for indication shortness of breath. Family understood that very well, patient is already DNR/DNI. Family wanted full comfort measure, while him finishing my interview with them patient vitals deteriorated and I explained to them she is actively dying.  While writing my note, I was notified by Dr. Radford Pax that patient is expired, ED department will take care of the death certificate and notify her primary care physician.  Chief Complaint: Shock  HPI:  [name] is a 78 year old African American female with past medical history of end-stage renal disease, recently  treated vertebral fracture and ileostomy. Patient was this chart from the hospital about a week ago after she was treated for compression fracture and pleural effusion. Patient came back to the hospital complaining about extreme shortness of breath, upon initial evaluation in the emergency department she was found to be hypotensive with systolic blood pressure of 85/43, her respiratory rate was 33, she was hypoxic and required nonrebreather mask to keep her saturation above 90. Dr. Radford Pax requested consultation for admission  Review of Systems:  Not able to provide review of system, patient is completely unresponsive.  Past Medical History  Diagnosis Date  . Anemia   . Cystoid macular edema   . Dialysis patient   . Cataract     h/o  . Glaucoma   . Hypertension   . Renal insufficiency   . COPD (chronic obstructive pulmonary disease)    Past Surgical History  Procedure Date  . Abdominal hysterectomy   . Appendectomy   . Cataract extraction   . Arteriovenous graft placement   . Thrombectomy w/ embolectomy 01/16/2011    Procedure: THROMBECTOMY ARTERIOVENOUS GORE-TEX GRAFT;  Surgeon: Juleen China, MD;  Location: [facility] OR;  Service: Vascular;  Laterality: Left;  repair of left arm graft bleed and revision of left arm graft.  . Kyphosis surgery     multiple levels lower thoracic and lumbar spine   Social History:  reports that she has never smoked. She does not have any smokeless tobacco history on file. She reports that she does not drink alcohol or use illicit drugs.  No Known Allergies No family history on file.  Prior to Admission medications   Medication Sig Start  Date End Date Taking? Authorizing Provider  albuterol (PROVENTIL HFA;VENTOLIN HFA) 108 (90 BASE) MCG/ACT inhaler Inhale 2 puffs into the lungs 4 (four) times daily as needed. For shortness of breath.    Historical Provider, MD  dipyridamole-aspirin (AGGRENOX) 25-200 MG per 12 hr capsule Take 1 capsule by mouth daily.      Historical Provider, MD  dorzolamide (TRUSOPT) 2 % ophthalmic solution Place 1 drop into both eyes 2 (two) times daily.     Historical Provider, MD  famotidine (PEPCID) 20 MG tablet Take 20 mg by mouth at bedtime.     Historical Provider, MD  latanoprost (XALATAN) 0.005 % ophthalmic solution Place 1 drop into both eyes at bedtime.     Historical Provider, MD  megestrol (MEGACE) 40 MG/ML suspension Take 400 mg by mouth 2 (two) times daily.     Historical Provider, MD  midodrine (PROAMATINE) 10 MG tablet Take 15 mg by mouth 3 (three) times a week. Takes 1 tablet prior to each HD treatments on Monday Wednesday and friday    Historical Provider, MD  multivitamin (RENA-VIT) TABS tablet Take 1 tablet by mouth daily.    Historical Provider, MD  protein supplement (RESOURCE BENEPROTEIN) 6 g POWD Take 1 scoop by mouth 2 (two) times daily with a meal.     Historical Provider, MD  timolol (BETIMOL) 0.5 % ophthalmic solution Place 1 drop into both eyes 2 (two) times daily.     Historical Provider, MD   Physical Exam: Blood pressure 95/58, pulse 97, temperature 97.1 F (36.2 C), temperature source Rectal, resp. rate 32, SpO2 86.00%. Filed Vitals:   13-Jan-2012 1700 2012/01/13 1715 2012/01/13 1730 01/13/2012 1745  BP: 83/67 95/63 106/72 95/58  Pulse: 107 108  97  Temp:   97.1 F (36.2 C)   TempSrc:   Rectal   Resp: 38 36 28 32  SpO2: 95% 100% 98% 86%    General: Unresponsive even to painful stimuli HEENT: anicteric sclera, pupils reactive to light and accommodation CVS: Tachycardia, S1-S2 heard clearly Chest: Bilateral coarse rhonchi Abdomen: soft nontender, nondistended, normal bowel sounds, there is right-sided ileostomy bag Extremities: Cold Neuro: Patient is unresponsive, neurological examination cannot be done.  Labs on Admission:  Basic Metabolic Panel:  Lab 01-13-2012 9147  NA 138  K 3.4*  CL 98  CO2 33*  GLUCOSE 167*  BUN 19  CREATININE 4.33*  CALCIUM 9.5  MG --  PHOS --   Liver  Function Tests: No results found for this basename: AST:5,ALT:5,ALKPHOS:5,BILITOT:5,PROT:5,ALBUMIN:5 in the last 168 hours No results found for this basename: LIPASE:5,AMYLASE:5 in the last 168 hours No results found for this basename: AMMONIA:5 in the last 168 hours CBC:  Lab 13-Jan-2012 1530  WBC 20.4*  NEUTROABS 16.7*  HGB 10.6*  HCT 34.1*  MCV 102.1*  PLT 376   Cardiac Enzymes: No results found for this basename: CKTOTAL:5,CKMB:5,CKMBINDEX:5,TROPONINI:5 in the last 168 hours BNP: No components found with this basename: POCBNP:5 CBG: No results found for this basename: GLUCAP:5 in the last 168 hours  Radiological Exams on Admission: Dg Chest Portable 1 View  01-13-12  *RADIOLOGY REPORT*  Clinical Data: Shortness of breath  PORTABLE CHEST - 1 VIEW  Comparison: 12/09/2011  Findings: There is central mild vascular congestion and mild perihilar interstitial prominence suspicious for mild interstitial edema.  Bilateral small pleural effusions right greater than left with bilateral basilar atelectasis or infiltrate. Multilevel prior vertebral plasty again noted lower thoracic spine.  IMPRESSION: Central vascular congestion and mild perihilar interstitial  prominence suspicious for early edema.  Bilateral small pleural effusion with bilateral basilar atelectasis or infiltrate.   Original Report Authenticated By: Natasha Mead, M.D.      Time spent: 70 minutes  [facility] Pager [phone]  If 7PM-7AM, please contact night-coverage [email or web address] Password [facility] 01-16-2012, 6:24 PM

## 2011-12-31 NOTE — ED Notes (Signed)
Dr. Clydia Llano at bedside and pt asystole on monitor.  Multiple family members at bedside and support given.  Chaplain and Pt advocate at bedside for additional support.

## 2011-12-31 DEATH — deceased

## 2015-11-26 NOTE — Telephone Encounter (Signed)
Pt was treated with VP, attempted to call pt back in 2013 and the phone call was never signed. Signing note to close document. JM
